# Patient Record
Sex: Male | Born: 1975 | Race: Black or African American | Hispanic: No | Smoking: Current every day smoker
Health system: Southern US, Community
[De-identification: ages and names within clinical notes are randomized; demographics above are authoritative.]

## PROBLEM LIST (undated history)

## (undated) DIAGNOSIS — K5792 Diverticulitis of intestine, part unspecified, without perforation or abscess without bleeding: Secondary | ICD-10-CM

## (undated) HISTORY — DX: Diverticulitis of intestine, part unspecified, without perforation or abscess without bleeding: K57.92

---

## 2011-04-26 ENCOUNTER — Inpatient Hospital Stay (INDEPENDENT_AMBULATORY_CARE_PROVIDER_SITE_OTHER)
Admission: RE | Admit: 2011-04-26 | Discharge: 2011-04-26 | Disposition: A | Discharge status: Home / Self Care | Payer: Self-pay | Source: Ambulatory Visit | Attending: Family Medicine | Admitting: Family Medicine

## 2011-04-26 DIAGNOSIS — A64 Unspecified sexually transmitted disease: Secondary | ICD-10-CM

## 2012-08-04 ENCOUNTER — Emergency Department (HOSPITAL_COMMUNITY): Payer: Self-pay

## 2012-08-04 ENCOUNTER — Emergency Department (HOSPITAL_COMMUNITY)
Admission: EM | Admit: 2012-08-04 | Discharge: 2012-08-04 | Disposition: A | Discharge status: Home / Self Care | Payer: Self-pay | Attending: Emergency Medicine | Admitting: Emergency Medicine

## 2012-08-04 ENCOUNTER — Encounter (HOSPITAL_COMMUNITY): Payer: Self-pay | Admitting: Emergency Medicine

## 2012-08-04 DIAGNOSIS — S9030XA Contusion of unspecified foot, initial encounter: Secondary | ICD-10-CM | POA: Insufficient documentation

## 2012-08-04 DIAGNOSIS — Y998 Other external cause status: Secondary | ICD-10-CM | POA: Insufficient documentation

## 2012-08-04 DIAGNOSIS — W208XXA Other cause of strike by thrown, projected or falling object, initial encounter: Secondary | ICD-10-CM | POA: Insufficient documentation

## 2012-08-04 DIAGNOSIS — F17200 Nicotine dependence, unspecified, uncomplicated: Secondary | ICD-10-CM | POA: Insufficient documentation

## 2012-08-04 DIAGNOSIS — Y9389 Activity, other specified: Secondary | ICD-10-CM | POA: Insufficient documentation

## 2012-08-04 DIAGNOSIS — S9031XA Contusion of right foot, initial encounter: Secondary | ICD-10-CM

## 2012-08-04 DIAGNOSIS — Y92009 Unspecified place in unspecified non-institutional (private) residence as the place of occurrence of the external cause: Secondary | ICD-10-CM | POA: Insufficient documentation

## 2012-08-04 MED ORDER — IBUPROFEN 800 MG PO TABS: 800.0000 mg | ORAL_TABLET | Freq: Three times a day (TID) | ORAL | Status: AC

## 2012-08-04 MED ORDER — HYDROCODONE-ACETAMINOPHEN 5-325 MG PO TABS
1.0000 | ORAL_TABLET | Freq: Once | ORAL | Status: AC
  Administered 2012-08-04: 1 via ORAL
  Filled 2012-08-04: qty 1

## 2012-08-04 NOTE — Progress Notes (Signed)
Orthopedic Tech Progress Note Patient Details:  Ruben Jacobs [birth year] [id number]  Ortho Devices Type of Ortho Device: Crutches Ortho Device/Splint Interventions: Adjustment   Cammer, Mickie Bail 08/04/2012, 11:22 AM
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
## 2012-08-04 NOTE — ED Notes (Signed)
Patient transported to X-ray 

## 2012-08-04 NOTE — ED Notes (Signed)
Pt. Is under Hospice Care , if we need any questions pleas call Claudette Laws  [phone]

## 2012-08-04 NOTE — ED Notes (Signed)
Pt. Stated, something fell on my foot, on the tot.  Rt. foot

## 2012-08-04 NOTE — ED Provider Notes (Signed)
Medical screening examination/treatment/procedure(s) were performed by non-physician practitioner and as supervising physician I was immediately available for consultation/collaboration.  Yandel Erhard Senske, MD 08/04/12 1556 

## 2012-08-27 ENCOUNTER — Emergency Department (HOSPITAL_COMMUNITY)
Admission: EM | Admit: 2012-08-27 | Discharge: 2012-08-27 | Disposition: A | Discharge status: Home / Self Care | Payer: Self-pay | Source: Home / Self Care | Attending: Emergency Medicine | Admitting: Emergency Medicine

## 2012-08-27 ENCOUNTER — Encounter (HOSPITAL_COMMUNITY): Payer: Self-pay

## 2012-08-27 DIAGNOSIS — R300 Dysuria: Secondary | ICD-10-CM

## 2012-08-27 LAB — POCT URINALYSIS DIP (DEVICE)
Bilirubin Urine: NEGATIVE
Glucose, UA: NEGATIVE mg/dL
Ketones, ur: NEGATIVE mg/dL
Leukocytes, UA: NEGATIVE
Nitrite: NEGATIVE
pH: 6 (ref 5.0–8.0)

## 2012-08-27 LAB — URINALYSIS, ROUTINE W REFLEX MICROSCOPIC
Glucose, UA: NEGATIVE mg/dL
Hgb urine dipstick: NEGATIVE
Ketones, ur: 15 mg/dL — AB
Protein, ur: NEGATIVE mg/dL
pH: 5.5 (ref 5.0–8.0)

## 2012-08-27 MED ORDER — CEFTRIAXONE SODIUM 250 MG IJ SOLR
INTRAMUSCULAR | Status: AC
  Filled 2012-08-27: qty 250

## 2012-08-27 MED ORDER — LIDOCAINE HCL (PF) 1 % IJ SOLN
INTRAMUSCULAR | Status: AC
  Filled 2012-08-27: qty 5

## 2012-08-27 MED ORDER — AZITHROMYCIN 250 MG PO TABS
1000.0000 mg | ORAL_TABLET | Freq: Once | ORAL | Status: AC
  Administered 2012-08-27: 1000 mg via ORAL

## 2012-08-27 MED ORDER — AZITHROMYCIN 250 MG PO TABS
ORAL_TABLET | ORAL | Status: AC
  Filled 2012-08-27: qty 4

## 2012-08-27 MED ORDER — CEFTRIAXONE SODIUM 250 MG IJ SOLR
250.0000 mg | Freq: Once | INTRAMUSCULAR | Status: AC
  Administered 2012-08-27: 250 mg via INTRAMUSCULAR

## 2012-08-27 NOTE — ED Provider Notes (Signed)
History     CSN: [id number]  Arrival date & time 08/27/12  4098   First MD Initiated Contact with Patient 08/27/12 1033      Chief Complaint  Patient presents with  . SEXUALLY TRANSMITTED DISEASE    (Consider location/radiation/quality/duration/timing/severity/associated sxs/prior treatment) HPI Comments: Patient reports intermittent dysuria for the past 2 days. Reports mild left upper abdominal pain prior to urinating, which resolved with urination. No nausea, vomiting. No change in bowel habits. No testicular pain, swelling. No genital rash. Sexually active with 2 male partners, her asymptomatic. One partner is new. He does not use condoms with either of them. No history of gonorrhea, Chlamydia, herpes, Trichomonas, HIV, syphilis, UTIs.  ROS as noted in HPI. All other ROS negative.   Patient is a 36 y.o. male presenting with dysuria. The history is provided by the patient. No language interpreter was used.  Dysuria  This is a new problem. The current episode started 2 days ago. The problem occurs intermittently. The problem has not changed since onset.The quality of the pain is described as burning. There has been no fever. He is sexually active. There is no history of pyelonephritis. Pertinent negatives include no chills, no sweats, no nausea, no vomiting, no discharge, no frequency, no hematuria, no hesitancy, no urgency and no flank pain. He has tried nothing for the symptoms.    History reviewed. No pertinent past medical history.  History reviewed. No pertinent past surgical history.  History reviewed. No pertinent family history.  History  Substance Use Topics  . Smoking status: Current Every Day Smoker    Types: Cigarettes  . Smokeless tobacco: Not on file  . Alcohol Use: Yes      Review of Systems  Constitutional: Negative for chills.  Gastrointestinal: Negative for nausea and vomiting.  Genitourinary: Positive for dysuria. Negative for hesitancy, urgency,  frequency, hematuria and flank pain.    Allergies  Review of patient's allergies indicates no known allergies.  Home Medications  No current outpatient prescriptions on file.  BP 144/85  Pulse 67  Temp 98.5 F (36.9 C) (Oral)  Resp 18  SpO2 98%  Physical Exam  Nursing note and vitals reviewed. Constitutional: He is oriented to person, place, and time. He appears well-developed and well-nourished.  HENT:  Head: Normocephalic and atraumatic.  Eyes: Conjunctivae normal and EOM are normal.  Neck: Normal range of motion.  Cardiovascular: Regular rhythm.   Pulmonary/Chest: Effort normal. No respiratory distress.  Abdominal: Normal appearance and bowel sounds are normal. He exhibits no distension. There is no tenderness. There is no CVA tenderness.  Genitourinary: Testes normal and penis normal. Circumcised. No penile erythema or penile tenderness. No discharge found.       no rash. Pt declined chaperone.   Musculoskeletal: Normal range of motion. He exhibits no edema and no tenderness.  Lymphadenopathy:       Right: No inguinal adenopathy present.       Left: No inguinal adenopathy present.  Neurological: He is alert and oriented to person, place, and time.  Skin: Skin is warm and dry. No rash noted.  Psychiatric: He has a normal mood and affect. His behavior is normal.    ED Course  Procedures (including critical care time)  Labs Reviewed  POCT URINALYSIS DIP (DEVICE) - Abnormal; Notable for the following:    Protein, ur 30 (*)     All other components within normal limits  URINALYSIS, ROUTINE W REFLEX MICROSCOPIC  GC/CHLAMYDIA PROBE AMP, GENITAL   No  results found.   1. Dysuria     Results for orders placed during the hospital encounter of 08/27/12  POCT URINALYSIS DIP (DEVICE)      Component Value Range   Glucose, UA NEGATIVE  NEGATIVE mg/dL   Bilirubin Urine NEGATIVE  NEGATIVE   Ketones, ur NEGATIVE  NEGATIVE mg/dL   Specific Gravity, Urine >=1.030  1.005 -  1.030   Hgb urine dipstick NEGATIVE  NEGATIVE   pH 6.0  5.0 - 8.0   Protein, ur 30 (*) NEGATIVE mg/dL   Urobilinogen, UA 0.2  0.0 - 1.0 mg/dL   Nitrite NEGATIVE  NEGATIVE   Leukocytes, UA NEGATIVE  NEGATIVE    MDM  H&P most c/w dysuria. Abdomen benign. Advised increased fluid intake. No signs of UTI. Sent off GC/chlamydia. Will  treat empirically now. Giving ceftriaxone 250 mg IM/azithro 1 gm po.  Advised pt to refrain from sexual contact until he knows lab results, symptoms resolve, and partner(s) are treated if necessary. Pt provided working phone number. Pt agrees.     Luiz Blare, MD 08/27/12 1300

## 2012-08-27 NOTE — ED Notes (Signed)
Concern for poss STD, UTI; c/o pain w urination, lower abdominal area pain: NAD

## 2012-08-28 LAB — GC/CHLAMYDIA PROBE AMP, GENITAL: GC Probe Amp, Genital: NEGATIVE

## 2013-05-13 ENCOUNTER — Encounter (HOSPITAL_COMMUNITY): Payer: Self-pay

## 2013-05-13 DIAGNOSIS — K089 Disorder of teeth and supporting structures, unspecified: Secondary | ICD-10-CM | POA: Insufficient documentation

## 2013-05-13 DIAGNOSIS — F17200 Nicotine dependence, unspecified, uncomplicated: Secondary | ICD-10-CM | POA: Insufficient documentation

## 2013-05-13 DIAGNOSIS — Z79899 Other long term (current) drug therapy: Secondary | ICD-10-CM | POA: Insufficient documentation

## 2013-05-13 NOTE — ED Notes (Signed)
Pt presents to ED c/o dental pain on the left lower side. Pt states that his tooth broke while eating yesterday afternoon and had had pain since then.

## 2013-05-14 ENCOUNTER — Emergency Department (HOSPITAL_COMMUNITY)
Admission: EM | Admit: 2013-05-14 | Discharge: 2013-05-14 | Disposition: A | Discharge status: Home / Self Care | Payer: Self-pay | Attending: Emergency Medicine | Admitting: Emergency Medicine

## 2013-05-14 DIAGNOSIS — K0889 Other specified disorders of teeth and supporting structures: Secondary | ICD-10-CM

## 2013-05-14 MED ORDER — HYDROCODONE-ACETAMINOPHEN 5-325 MG PO TABS: 1.0000 | ORAL_TABLET | Freq: Three times a day (TID) | ORAL | Status: DC | PRN

## 2013-05-14 MED ORDER — IBUPROFEN 400 MG PO TABS
800.0000 mg | ORAL_TABLET | Freq: Once | ORAL | Status: AC
  Administered 2013-05-14: 800 mg via ORAL
  Filled 2013-05-14: qty 2

## 2013-05-14 MED ORDER — PENICILLIN V POTASSIUM 500 MG PO TABS: 500.0000 mg | ORAL_TABLET | Freq: Four times a day (QID) | ORAL | Status: AC

## 2013-05-14 MED ORDER — HYDROCODONE-ACETAMINOPHEN 5-325 MG PO TABS: 2.0000 | ORAL_TABLET | Freq: Once | ORAL | Status: DC
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
## 2013-05-14 NOTE — ED Provider Notes (Signed)
Medical screening examination/treatment/procedure(s) were performed by non-physician practitioner and as supervising physician I was immediately available for consultation/collaboration. Devlyn Retter, MD, FACEP   Ieasha Boerema L Jenea Dake, MD 05/14/13 1240 

## 2013-08-17 ENCOUNTER — Emergency Department (HOSPITAL_COMMUNITY)
Admission: EM | Admit: 2013-08-17 | Discharge: 2013-08-17 | Disposition: A | Discharge status: Home / Self Care | Payer: Self-pay | Attending: Emergency Medicine | Admitting: Emergency Medicine

## 2013-08-17 ENCOUNTER — Encounter (HOSPITAL_COMMUNITY): Payer: Self-pay | Admitting: *Deleted

## 2013-08-17 DIAGNOSIS — F1021 Alcohol dependence, in remission: Secondary | ICD-10-CM | POA: Insufficient documentation

## 2013-08-17 DIAGNOSIS — R1110 Vomiting, unspecified: Secondary | ICD-10-CM | POA: Insufficient documentation

## 2013-08-17 DIAGNOSIS — R1032 Left lower quadrant pain: Secondary | ICD-10-CM | POA: Insufficient documentation

## 2013-08-17 DIAGNOSIS — F17200 Nicotine dependence, unspecified, uncomplicated: Secondary | ICD-10-CM | POA: Insufficient documentation

## 2013-08-17 DIAGNOSIS — R630 Anorexia: Secondary | ICD-10-CM | POA: Insufficient documentation

## 2013-08-17 DIAGNOSIS — R197 Diarrhea, unspecified: Secondary | ICD-10-CM | POA: Insufficient documentation

## 2013-08-17 DIAGNOSIS — R109 Unspecified abdominal pain: Secondary | ICD-10-CM

## 2013-08-17 LAB — CBC WITH DIFFERENTIAL/PLATELET
HCT: 42.5 % (ref 39.0–52.0)
Hemoglobin: 14.8 g/dL (ref 13.0–17.0)
Lymphocytes Relative: 28 % (ref 12–46)
Lymphs Abs: 2.1 10*3/uL (ref 0.7–4.0)
MCHC: 34.8 g/dL (ref 30.0–36.0)
Monocytes Absolute: 0.8 10*3/uL (ref 0.1–1.0)
Monocytes Relative: 11 % (ref 3–12)
Neutro Abs: 4.5 10*3/uL (ref 1.7–7.7)
Neutrophils Relative %: 59 % (ref 43–77)
RBC: 5.13 MIL/uL (ref 4.22–5.81)
WBC: 7.6 10*3/uL (ref 4.0–10.5)

## 2013-08-17 LAB — COMPREHENSIVE METABOLIC PANEL
Albumin: 3.9 g/dL (ref 3.5–5.2)
Alkaline Phosphatase: 67 U/L (ref 39–117)
BUN: 10 mg/dL (ref 6–23)
CO2: 22 mEq/L (ref 19–32)
Chloride: 101 mEq/L (ref 96–112)
Creatinine, Ser: 0.97 mg/dL (ref 0.50–1.35)
GFR calc non Af Amer: >90 mL/min (ref >90)
Glucose, Bld: 86 mg/dL (ref 70–99)
Potassium: 3.4 mEq/L — ABNORMAL LOW (ref 3.5–5.1)
Total Bilirubin: 0.4 mg/dL (ref 0.3–1.2)

## 2013-08-17 LAB — LIPASE, BLOOD: Lipase: 24 U/L (ref 11–59)

## 2013-08-17 LAB — URINALYSIS, ROUTINE W REFLEX MICROSCOPIC
Bilirubin Urine: NEGATIVE
Ketones, ur: 15 mg/dL — AB
Leukocytes, UA: NEGATIVE
Nitrite: NEGATIVE
Protein, ur: NEGATIVE mg/dL
pH: 6.5 (ref 5.0–8.0)

## 2013-08-17 MED ORDER — TRAMADOL HCL 50 MG PO TABS: 50.0000 mg | ORAL_TABLET | Freq: Four times a day (QID) | ORAL | Status: DC | PRN

## 2013-08-17 MED ORDER — ONDANSETRON 4 MG PO TBDP: 4.0000 mg | ORAL_TABLET | Freq: Three times a day (TID) | ORAL | Status: DC | PRN

## 2013-08-17 MED ORDER — MORPHINE SULFATE 4 MG/ML IJ SOLN
4.0000 mg | Freq: Once | INTRAMUSCULAR | Status: AC
  Administered 2013-08-17: 4 mg via INTRAVENOUS
  Filled 2013-08-17: qty 1

## 2013-08-17 MED ORDER — ONDANSETRON HCL 4 MG/2ML IJ SOLN
4.0000 mg | Freq: Once | INTRAMUSCULAR | Status: AC
  Administered 2013-08-17: 4 mg via INTRAVENOUS
  Filled 2013-08-17: qty 2

## 2013-08-17 MED ORDER — SODIUM CHLORIDE 0.9 % IV BOLUS (SEPSIS)
1000.0000 mL | Freq: Once | INTRAVENOUS | Status: AC
  Administered 2013-08-17: 1000 mL via INTRAVENOUS

## 2013-08-17 MED ORDER — POTASSIUM CHLORIDE CRYS ER 20 MEQ PO TBCR
40.0000 meq | EXTENDED_RELEASE_TABLET | Freq: Once | ORAL | Status: AC
  Administered 2013-08-17: 40 meq via ORAL
  Filled 2013-08-17: qty 2

## 2013-08-17 NOTE — Progress Notes (Signed)
Met patient at bedside.Role of case manager explained.Patient reports understanding. Patient requests information on Medication / PCP resources.Patient provided with the [facility] and the [facility] clinic/ urgent care resource sheet.Patient will follow up next week and make an appointment at the clinic. Patient provided with pricing option for Tramadol medications. Patient aware medication is not avaliable  via the [facility] program as it is a narcotic.Advised patient I would look at options for nausea medication and would return.Upon returning to the room at 15.20pm-  the patient had left the room.Waiting area checked.Patient was not in wait area.Patients RN Aware.

## 2013-08-17 NOTE — ED Notes (Signed)
Patient is resting comfortably. 

## 2013-08-17 NOTE — ED Notes (Addendum)
PT c/o lower abdominal pain since Thursday.  1 episode of emesis on Thurs, but since then only diarrhea.  Pt states every time he eats he has diarrhea.  Pt states he drinks 4 40 oz beers per day.

## 2013-08-17 NOTE — ED Notes (Signed)
Case manager speaking with pt. About RX refills.

## 2013-08-17 NOTE — ED Notes (Signed)
Pt walked to the bathroom with no difficulty

## 2013-08-17 NOTE — ED Provider Notes (Signed)
CSN: [id number]     Arrival date & time 08/17/13  1016 History   First MD Initiated Contact with Patient 08/17/13 1039     Chief Complaint  Patient presents with  . Abdominal Pain  . Diarrhea   (Consider location/radiation/quality/duration/timing/severity/associated sxs/prior Treatment) HPI Comments: Patient is a 37 yo M PMHx significant for ETOH dependence presenting tot he ED for three days of waxing and waning moderate to severe cramping non-radiating left lower abdominal pain w/o associated non-bloody diarrhea. Patient states he had one associated episode of non-bloody emesis as well as decreased PO intake, but denies fevers or chills. Patient states his pain and diarrhea are precipitated by eating and drinking. Patient denies any alleviating factors. No history of abdominal surgeries. No recent antibiotic use.    History reviewed. No pertinent past medical history. History reviewed. No pertinent past surgical history. No family history on file. History  Substance Use Topics  . Smoking status: Current Every Day Smoker -- 0.50 packs/day    Types: Cigarettes  . Smokeless tobacco: Not on file  . Alcohol Use: 16.8 oz/week    28 Cans of beer per week     Comment: 4 40 oz per day    Review of Systems  Constitutional: Negative for fever and chills.  Respiratory: Negative for shortness of breath.   Cardiovascular: Negative for chest pain.  Gastrointestinal: Positive for nausea, abdominal pain and diarrhea. Negative for blood in stool and anal bleeding.  Neurological: Negative for headaches.  All other systems reviewed and are negative.    Allergies  Review of patient's allergies indicates no known allergies.  Home Medications   Current Outpatient Rx  Name  Route  Sig  Dispense  Refill  . alum & mag hydroxide-simeth (MAALOX/MYLANTA) 200-200-20 MG/5ML suspension   Oral   Take 15 mLs by mouth every 6 (six) hours as needed for indigestion.         . ondansetron (ZOFRAN ODT) 4  MG disintegrating tablet   Oral   Take 1 tablet (4 mg total) by mouth every 8 (eight) hours as needed for nausea.   10 tablet   0   . traMADol (ULTRAM) 50 MG tablet   Oral   Take 1 tablet (50 mg total) by mouth every 6 (six) hours as needed for pain.   10 tablet   0    BP 110/74  Pulse 74  Temp(Src) 98.7 F (37.1 C) (Oral)  Resp 20  SpO2 98% Physical Exam  Constitutional: He is oriented to person, place, and time. He appears well-developed and well-nourished.  HENT:  Head: Normocephalic and atraumatic.  Right Ear: External ear normal.  Left Ear: External ear normal.  Nose: Nose normal.  Eyes: Conjunctivae are normal.  Neck: Neck supple.  Cardiovascular: Normal rate, regular rhythm and normal heart sounds.   Pulmonary/Chest: Effort normal and breath sounds normal.  Abdominal: Soft. Normal appearance and bowel sounds are normal. He exhibits no distension. There is tenderness in the left lower quadrant. There is no rigidity, no rebound and no guarding.  Musculoskeletal: Normal range of motion.  Neurological: He is alert and oriented to person, place, and time.  Skin: Skin is warm and dry. He is not diaphoretic.    ED Course  Procedures (including critical care time)  Medications  morphine 4 MG/ML injection 4 mg (4 mg Intravenous Given 08/17/13 1142)  ondansetron (ZOFRAN) injection 4 mg (4 mg Intravenous Given 08/17/13 1139)  sodium chloride 0.9 % bolus 1,000 mL (0 mLs  Intravenous Stopped 08/17/13 1409)  potassium chloride SA (K-DUR,KLOR-CON) CR tablet 40 mEq (40 mEq Oral Given 08/17/13 1415)    Labs Review Labs Reviewed  COMPREHENSIVE METABOLIC PANEL - Abnormal; Notable for the following:    Potassium 3.4 (*)    All other components within normal limits  URINALYSIS, ROUTINE W REFLEX MICROSCOPIC - Abnormal; Notable for the following:    Ketones, ur 15 (*)    All other components within normal limits  CBC WITH DIFFERENTIAL  LIPASE, BLOOD   Imaging Review No results  found.  MDM   1. Abdominal pain   2. Diarrhea     Afebrile, NAD, non-toxic appearing, AAOx4. Patient is nontoxic, nonseptic appearing, in no apparent distress.  Patient's pain and other symptoms adequately managed in emergency department. Fluids bolus given.  Labs and vitals reviewed.  Patient does not meet the SIRS or Sepsis criteria.  On repeat exam patient does not have a surgical abdomen and there are nor peritoneal signs.  No indication of appendicitis, bowel obstruction, bowel perforation, cholecystitis, diverticulitis.  Patient discharged home with symptomatic treatment and given strict instructions for follow-up with their primary care physician.  I have also discussed reasons to return immediately to the ER.  Patient expresses understanding and agrees with plan. Patient is stable at time of discharge. Patient d/w with Dr. Freida Busman, agrees with plan.          Jeannetta Ellis, PA-C 08/17/13 1706

## 2013-08-18 NOTE — ED Provider Notes (Signed)
Medical screening examination/treatment/procedure(s) were performed by non-physician practitioner and as supervising physician I was immediately available for consultation/collaboration.  Quaneisha Hanisch T Ellington Cornia, MD 08/18/13 2040 

## 2015-05-17 ENCOUNTER — Emergency Department (HOSPITAL_COMMUNITY)
Admission: EM | Admit: 2015-05-17 | Discharge: 2015-05-17 | Disposition: A | Discharge status: Home / Self Care | Payer: Self-pay | Attending: Emergency Medicine | Admitting: Emergency Medicine

## 2015-05-17 ENCOUNTER — Encounter (HOSPITAL_COMMUNITY): Payer: Self-pay | Admitting: *Deleted

## 2015-05-17 ENCOUNTER — Emergency Department (HOSPITAL_COMMUNITY): Payer: Self-pay

## 2015-05-17 DIAGNOSIS — Y92410 Unspecified street and highway as the place of occurrence of the external cause: Secondary | ICD-10-CM | POA: Insufficient documentation

## 2015-05-17 DIAGNOSIS — Y9389 Activity, other specified: Secondary | ICD-10-CM | POA: Insufficient documentation

## 2015-05-17 DIAGNOSIS — Y998 Other external cause status: Secondary | ICD-10-CM | POA: Insufficient documentation

## 2015-05-17 DIAGNOSIS — S8992XA Unspecified injury of left lower leg, initial encounter: Secondary | ICD-10-CM | POA: Insufficient documentation

## 2015-05-17 DIAGNOSIS — Z23 Encounter for immunization: Secondary | ICD-10-CM | POA: Insufficient documentation

## 2015-05-17 DIAGNOSIS — Z720 Tobacco use: Secondary | ICD-10-CM | POA: Insufficient documentation

## 2015-05-17 MED ORDER — TETANUS-DIPHTH-ACELL PERTUSSIS 5-2.5-18.5 LF-MCG/0.5 IM SUSP
0.5000 mL | Freq: Once | INTRAMUSCULAR | Status: AC
  Administered 2015-05-17: 0.5 mL via INTRAMUSCULAR
  Filled 2015-05-17: qty 0.5
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
## 2015-05-17 NOTE — ED Notes (Signed)
Declined W/C at D/C and was escorted to lobby by RN. 

## 2015-05-17 NOTE — Discharge Instructions (Signed)
Return to the emergency room with worsening of symptoms, new symptoms or with symptoms that are concerning, especially fevers, redness, swelling, numbness tingling, or weakness. RICE: Rest, Ice (three cycles of 20 mins on, off at least twice a day), compression/brace, elevation. Heating pad works well for back pain. Ibuprofen  (2 tablets ) every 5-6 hours for 3-5 days. Follow up with PCP/orthopedist if symptoms worsen or are persistent. Read below information and follow recommendations. Knee Pain The knee is the complex joint between your thigh and your lower leg. It is made up of bones, tendons, ligaments, and cartilage. The bones that make up the knee are:  The femur in the thigh.  The tibia and fibula in the lower leg.  The patella or kneecap riding in the groove on the lower femur. CAUSES  Knee pain is a common complaint with many causes. A few of these causes are:  Injury, such as:  A ruptured ligament or tendon injury.  Torn cartilage.  Medical conditions, such as:  Gout  Arthritis  Infections  Overuse, over training, or overdoing a physical activity. Knee pain can be minor or severe. Knee pain can accompany debilitating injury. Minor knee problems often respond well to self-care measures or get well on their own. More serious injuries may need medical intervention or even surgery. SYMPTOMS The knee is complex. Symptoms of knee problems can vary widely. Some of the problems are:  Pain with movement and weight bearing.  Swelling and tenderness.  Buckling of the knee.  Inability to straighten or extend your knee.  Your knee locks and you cannot straighten it.  Warmth and redness with pain and fever.  Deformity or dislocation of the kneecap. DIAGNOSIS  Determining what is wrong may be very straight forward such as when there is an injury. It can also be challenging because of the complexity of the knee. Tests to make a diagnosis may  include:  Your caregiver taking a history and doing a physical exam.  Routine X-rays can be used to rule out other problems. X-rays will not reveal a cartilage tear. Some injuries of the knee can be diagnosed by:  Arthroscopy a surgical technique by which a small video camera is inserted through tiny incisions on the sides of the knee. This procedure is used to examine and repair internal knee joint problems. Tiny instruments can be used during arthroscopy to repair the torn knee cartilage (meniscus).  Arthrography is a radiology technique. A contrast liquid is directly injected into the knee joint. Internal structures of the knee joint then become visible on X-ray film.  An MRI scan is a non X-ray radiology procedure in which magnetic fields and a computer produce two- or three-dimensional images of the inside of the knee. Cartilage tears are often visible using an MRI scanner. MRI scans have largely replaced arthrography in diagnosing cartilage tears of the knee.  Blood work.  Examination of the fluid that helps to lubricate the knee joint (synovial fluid). This is done by taking a sample out using a needle and a syringe. TREATMENT The treatment of knee problems depends on the cause. Some of these treatments are:  Depending on the injury, proper casting, splinting, surgery, or physical therapy care will be needed.  Give yourself adequate recovery time. Do not overuse your joints. If you begin to get sore during workout routines, back off. Slow down or do fewer repetitions.  For repetitive activities such as cycling or running, maintain your strength and nutrition.  Alternate  muscle groups. For example, if you are a weight lifter, work the upper body on one day and the lower body the next.  Either tight or weak muscles do not give the proper support for your knee. Tight or weak muscles do not absorb the stress placed on the knee joint. Keep the muscles surrounding the knee strong.  Take  care of mechanical problems.  If you have flat feet, orthotics or special shoes may help. See your caregiver if you need help.  Arch supports, sometimes with wedges on the inner or outer aspect of the heel, can help. These can shift pressure away from the side of the knee most bothered by osteoarthritis.  A brace called an "unloader" brace also may be used to help ease the pressure on the most arthritic side of the knee.  If your caregiver has prescribed crutches, braces, wraps or ice, use as directed. The acronym for this is PRICE. This means protection, rest, ice, compression, and elevation.  Nonsteroidal anti-inflammatory drugs (NSAIDs), can help relieve pain. But if taken immediately after an injury, they may actually increase swelling. Take NSAIDs with food in your stomach. Stop them if you develop stomach problems. Do not take these if you have a history of ulcers, stomach pain, or bleeding from the bowel. Do not take without your caregiver's approval if you have problems with fluid retention, heart failure, or kidney problems.  For ongoing knee problems, physical therapy may be helpful.  Glucosamine and chondroitin are over-the-counter dietary supplements. Both may help relieve the pain of osteoarthritis in the knee. These medicines are different from the usual anti-inflammatory drugs. Glucosamine may decrease the rate of cartilage destruction.  Injections of a corticosteroid drug into your knee joint may help reduce the symptoms of an arthritis flare-up. They may provide pain relief that lasts a few months. You may have to wait a few months between injections. The injections do have a small increased risk of infection, water retention, and elevated blood sugar levels.  Hyaluronic acid injected into damaged joints may ease pain and provide lubrication. These injections may work by reducing inflammation. A series of shots may give relief for as long as 6 months.  Topical painkillers.  Applying certain ointments to your skin may help relieve the pain and stiffness of osteoarthritis. Ask your pharmacist for suggestions. Many over the-counter products are approved for temporary relief of arthritis pain.  In some countries, doctors often prescribe topical NSAIDs for relief of chronic conditions such as arthritis and tendinitis. A review of treatment with NSAID creams found that they worked as well as oral medications but without the serious side effects. PREVENTION  Maintain a healthy weight. Extra pounds put more strain on your joints.  Get strong, stay limber. Weak muscles are a common cause of knee injuries. Stretching is important. Include flexibility exercises in your workouts.  Be smart about exercise. If you have osteoarthritis, chronic knee pain or recurring injuries, you may need to change the way you exercise. This does not mean you have to stop being active. If your knees ache after jogging or playing basketball, consider switching to swimming, water aerobics, or other low-impact activities, at least for a few days a week. Sometimes limiting high-impact activities will provide relief.  Make sure your shoes fit well. Choose footwear that is right for your sport.  Protect your knees. Use the proper gear for knee-sensitive activities. Use kneepads when playing volleyball or laying carpet. Buckle your seat belt every time you drive.  Most shattered kneecaps occur in car accidents.  Rest when you are tired. SEEK MEDICAL CARE IF:  You have knee pain that is continual and does not seem to be getting better.  SEEK IMMEDIATE MEDICAL CARE IF:  Your knee joint feels hot to the touch and you have a high fever. MAKE SURE YOU:   Understand these instructions.  Will watch your condition.  Will get help right away if you are not doing well or get worse. Document Released: 09/25/2007 Document Revised: 02/20/2012 Document Reviewed: 09/25/2007 [facility] Patient Information 2015  [place], [place]. This information is not intended to replace advice given to you by your health care provider. Make sure you discuss any questions you have with your health care provider.

## 2015-05-17 NOTE — ED Notes (Signed)
Patient returned from xray.

## 2015-06-30 ENCOUNTER — Encounter (HOSPITAL_COMMUNITY): Payer: Self-pay | Admitting: Emergency Medicine

## 2015-06-30 ENCOUNTER — Emergency Department (HOSPITAL_COMMUNITY)
Admission: EM | Admit: 2015-06-30 | Discharge: 2015-06-30 | Disposition: A | Discharge status: Home / Self Care | Payer: No Typology Code available for payment source | Attending: Physician Assistant | Admitting: Physician Assistant

## 2015-06-30 DIAGNOSIS — Y92410 Unspecified street and highway as the place of occurrence of the external cause: Secondary | ICD-10-CM | POA: Diagnosis not present

## 2015-06-30 DIAGNOSIS — Z720 Tobacco use: Secondary | ICD-10-CM | POA: Insufficient documentation

## 2015-06-30 DIAGNOSIS — Y999 Unspecified external cause status: Secondary | ICD-10-CM | POA: Insufficient documentation

## 2015-06-30 DIAGNOSIS — S39012A Strain of muscle, fascia and tendon of lower back, initial encounter: Secondary | ICD-10-CM | POA: Insufficient documentation

## 2015-06-30 DIAGNOSIS — Y9389 Activity, other specified: Secondary | ICD-10-CM | POA: Diagnosis not present

## 2015-06-30 DIAGNOSIS — T148XXA Other injury of unspecified body region, initial encounter: Secondary | ICD-10-CM

## 2015-06-30 DIAGNOSIS — S3992XA Unspecified injury of lower back, initial encounter: Secondary | ICD-10-CM | POA: Diagnosis present

## 2015-06-30 MED ORDER — IBUPROFEN 800 MG PO TABS
800.0000 mg | ORAL_TABLET | Freq: Once | ORAL | Status: AC
  Administered 2015-06-30: 800 mg via ORAL
  Filled 2015-06-30: qty 1

## 2015-06-30 MED ORDER — CYCLOBENZAPRINE HCL 10 MG PO TABS
10.0000 mg | ORAL_TABLET | Freq: Once | ORAL | Status: AC
  Administered 2015-06-30: 10 mg via ORAL
  Filled 2015-06-30: qty 1

## 2015-06-30 MED ORDER — CYCLOBENZAPRINE HCL 10 MG PO TABS: 10.0000 mg | ORAL_TABLET | Freq: Three times a day (TID) | ORAL | Status: DC | PRN

## 2015-06-30 MED ORDER — IBUPROFEN 800 MG PO TABS: 800.0000 mg | ORAL_TABLET | Freq: Three times a day (TID) | ORAL | Status: DC
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
## 2015-06-30 NOTE — Discharge Instructions (Signed)
Please return with any blood in your urine, nausea or vomiting, or fevers.   Muscle Strain A muscle strain is an injury that occurs when a muscle is stretched beyond its normal length. Usually a small number of muscle fibers are torn when this happens. Muscle strain is rated in degrees. First-degree strains have the least amount of muscle fiber tearing and pain. Second-degree and third-degree strains have increasingly more tearing and pain.  Usually, recovery from muscle strain takes 1-2 weeks. Complete healing takes 5-6 weeks.  CAUSES  Muscle strain happens when a sudden, violent force placed on a muscle stretches it too far. This may occur with lifting, sports, or a fall.  RISK FACTORS Muscle strain is especially common in athletes.  SIGNS AND SYMPTOMS At the site of the muscle strain, there may be:  Pain.  Bruising.  Swelling.  Difficulty using the muscle due to pain or lack of normal function. DIAGNOSIS  Your health care provider will perform a physical exam and ask about your medical history. TREATMENT  Often, the best treatment for a muscle strain is resting, icing, and applying cold compresses to the injured area.  HOME CARE INSTRUCTIONS   Use the PRICE method of treatment to promote muscle healing during the first 2-3 days after your injury. The PRICE method involves:  Protecting the muscle from being injured again.  Restricting your activity and resting the injured body part.  Icing your injury. To do this, put ice in a plastic bag. Place a towel between your skin and the bag. Then, apply the ice and leave it on from 15-20 minutes each hour. After the third day, switch to moist heat packs.  Apply compression to the injured area with a splint or elastic bandage. Be careful not to wrap it too tightly. This may interfere with blood circulation or increase swelling.  Elevate the injured body part above the level of your heart as often as you can.  Only take over-the-counter  or prescription medicines for pain, discomfort, or fever as directed by your health care provider.  Warming up prior to exercise helps to prevent future muscle strains. SEEK MEDICAL CARE IF:   You have increasing pain or swelling in the injured area.  You have numbness, tingling, or a significant loss of strength in the injured area. MAKE SURE YOU:   Understand these instructions.  Will watch your condition.  Will get help right away if you are not doing well or get worse. Document Released: 11/28/2005 Document Revised: 09/18/2013 Document Reviewed: 06/27/2013 [facility] Patient Information 2015 [place]. This information is not intended to replace advice given to you by your health care provider. Make sure you discuss any questions you have with your health care provider.
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
## 2016-08-18 ENCOUNTER — Encounter (HOSPITAL_COMMUNITY): Payer: Self-pay

## 2018-06-13 ENCOUNTER — Emergency Department (HOSPITAL_COMMUNITY): Payer: Self-pay

## 2018-06-13 ENCOUNTER — Other Ambulatory Visit: Payer: Self-pay

## 2018-06-13 ENCOUNTER — Emergency Department (HOSPITAL_COMMUNITY)
Admission: EM | Admit: 2018-06-13 | Discharge: 2018-06-13 | Disposition: A | Discharge status: Home / Self Care | Payer: Self-pay | Attending: Emergency Medicine | Admitting: Emergency Medicine

## 2018-06-13 ENCOUNTER — Encounter (HOSPITAL_COMMUNITY): Payer: Self-pay

## 2018-06-13 DIAGNOSIS — R1032 Left lower quadrant pain: Secondary | ICD-10-CM | POA: Insufficient documentation

## 2018-06-13 DIAGNOSIS — F17210 Nicotine dependence, cigarettes, uncomplicated: Secondary | ICD-10-CM | POA: Insufficient documentation

## 2018-06-13 DIAGNOSIS — K5732 Diverticulitis of large intestine without perforation or abscess without bleeding: Secondary | ICD-10-CM | POA: Insufficient documentation

## 2018-06-13 LAB — COMPREHENSIVE METABOLIC PANEL
ALBUMIN: 4.1 g/dL (ref 3.5–5.0)
ALT: 28 U/L (ref 0–44)
AST: 29 U/L (ref 15–41)
Alkaline Phosphatase: 74 U/L (ref 38–126)
Anion gap: 10 (ref 5–15)
BUN: 11 mg/dL (ref 6–20)
CHLORIDE: 106 mmol/L (ref 98–111)
CO2: 21 mmol/L — ABNORMAL LOW (ref 22–32)
Calcium: 9.3 mg/dL (ref 8.9–10.3)
Creatinine, Ser: 1.15 mg/dL (ref 0.61–1.24)
GFR calc Af Amer: >60 mL/min (ref >60)
GLUCOSE: 142 mg/dL — AB (ref 70–99)
Potassium: 4 mmol/L (ref 3.5–5.1)
Sodium: 137 mmol/L (ref 135–145)
Total Bilirubin: 1.2 mg/dL (ref 0.3–1.2)
Total Protein: 8 g/dL (ref 6.5–8.1)

## 2018-06-13 LAB — URINALYSIS, ROUTINE W REFLEX MICROSCOPIC
Bilirubin Urine: NEGATIVE
Glucose, UA: NEGATIVE mg/dL
Ketones, ur: NEGATIVE mg/dL
Leukocytes, UA: NEGATIVE
Nitrite: NEGATIVE
PH: 5 (ref 5.0–8.0)
Protein, ur: NEGATIVE mg/dL
SPECIFIC GRAVITY, URINE: 1.026 (ref 1.005–1.030)

## 2018-06-13 LAB — CBC
HEMATOCRIT: 45.1 % (ref 39.0–52.0)
Hemoglobin: 15.2 g/dL (ref 13.0–17.0)
MCH: 29.8 pg (ref 26.0–34.0)
MCHC: 33.7 g/dL (ref 30.0–36.0)
MCV: 88.4 fL (ref 78.0–100.0)
PLATELETS: 280 10*3/uL (ref 150–400)
RBC: 5.1 MIL/uL (ref 4.22–5.81)
RDW: 14.3 % (ref 11.5–15.5)
WBC: 12.2 10*3/uL — AB (ref 4.0–10.5)

## 2018-06-13 LAB — LIPASE, BLOOD: LIPASE: 29 U/L (ref 11–51)

## 2018-06-13 MED ORDER — METRONIDAZOLE 500 MG PO TABS: 500.0000 mg | ORAL_TABLET | Freq: Two times a day (BID) | ORAL | 0 refills | Status: DC

## 2018-06-13 MED ORDER — IOPAMIDOL (ISOVUE-300) INJECTION 61%
INTRAVENOUS | Status: AC
  Filled 2018-06-13: qty 100

## 2018-06-13 MED ORDER — MORPHINE SULFATE (PF) 4 MG/ML IV SOLN
4.0000 mg | Freq: Once | INTRAVENOUS | Status: AC
  Administered 2018-06-13: 4 mg via INTRAVENOUS
  Filled 2018-06-13: qty 1

## 2018-06-13 MED ORDER — SODIUM CHLORIDE 0.9 % IV BOLUS
1000.0000 mL | Freq: Once | INTRAVENOUS | Status: AC
  Administered 2018-06-13: 1000 mL via INTRAVENOUS

## 2018-06-13 MED ORDER — METRONIDAZOLE 500 MG PO TABS
500.0000 mg | ORAL_TABLET | Freq: Once | ORAL | Status: AC
  Administered 2018-06-13: 500 mg via ORAL
  Filled 2018-06-13: qty 1

## 2018-06-13 MED ORDER — IOPAMIDOL (ISOVUE-300) INJECTION 61%
100.0000 mL | Freq: Once | INTRAVENOUS | Status: AC | PRN
  Administered 2018-06-13: 100 mL via INTRAVENOUS

## 2018-06-13 MED ORDER — CIPROFLOXACIN HCL 500 MG PO TABS
500.0000 mg | ORAL_TABLET | Freq: Once | ORAL | Status: AC
  Administered 2018-06-13: 500 mg via ORAL
  Filled 2018-06-13: qty 1

## 2018-06-13 MED ORDER — CIPROFLOXACIN HCL 500 MG PO TABS: 500.0000 mg | ORAL_TABLET | Freq: Two times a day (BID) | ORAL | 0 refills | Status: DC

## 2018-06-13 NOTE — Discharge Instructions (Signed)
Your evaluated in the emergency department for left-sided low abdominal pain.  You had blood work and a CAT scan and this showed that you have acute diverticulitis.  This is usually treated by antibiotics and we are prescribing you 2 antibiotics to finish.  If you are running high fevers or worsening pain or other concerns please return to the emergency department.

## 2018-06-13 NOTE — ED Notes (Signed)
Pt made aware urine needed. Pt provided with a urinal

## 2018-06-13 NOTE — ED Notes (Signed)
Patient transported to CT 

## 2018-06-13 NOTE — ED Notes (Signed)
MD at bedside. 

## 2018-06-13 NOTE — ED Provider Notes (Signed)
[facility]-EMERGENCY DEPT Provider Note   CSN: [id number] Arrival date & time: 06/13/18  0906     History   Chief Complaint Chief Complaint  Patient presents with  . Abdominal Pain    HPI Ruben Jacobs is a 42 y.o. male.  He has no significant past medical history.  He is complaining of lower abdominal pain that started on the generalized lower abdomen but has migrated to be over into the left lower quadrant since yesterday.  It is not associated with any symptoms including no fevers no chills no nausea no vomiting no diarrhea no constipation no urinary symptoms.  It does not radiate anywhere.  He states it is worse with moving and improves with being [place].  He has tried nothing for it.  He thought the pain was gas in his facets sharp and stabbing and moderate in nature.  The history is provided by the patient.  Abdominal Pain   This is a new problem. The current episode started yesterday. The problem occurs constantly. The problem has been gradually worsening. The pain is associated with an unknown factor. The pain is located in the LLQ. The quality of the pain is sharp. The pain is moderate. Pertinent negatives include anorexia, fever, diarrhea, hematochezia, melena, nausea, vomiting, constipation, dysuria, frequency, hematuria and arthralgias. The symptoms are aggravated by activity and certain positions. The symptoms are relieved by being [place].    History reviewed. No pertinent past medical history.  There are no active problems to display for this patient.   History reviewed. No pertinent surgical history.      Home Medications    Prior to Admission medications   Medication Sig Start Date End Date Taking? Authorizing Provider  cyclobenzaprine (FLEXERIL) 10 MG tablet Take 1 tablet (10 mg total) by mouth 3 (three) times daily as needed for muscle spasms. 06/30/15   Mackuen, Courteney Lyn, MD  ibuprofen (ADVIL,MOTRIN) 800 MG tablet Take 1 tablet (800 mg  total) by mouth 3 (three) times daily. 06/30/15   Mackuen, Courteney Lyn, MD  Menthol, Topical Analgesic, (ICY HOT BACK EX) Apply 1 Package topically once.    [provider]  ondansetron (ZOFRAN ODT) 4 MG disintegrating tablet Take 1 tablet (4 mg total) by mouth every 8 (eight) hours as needed for nausea. Patient not taking: Reported on 06/30/2015 08/17/13   Francee Piccolo, PA-C  PRESCRIPTION MEDICATION Apply 1 patch topically once. PAIN PATCH    [provider]  traMADol (ULTRAM) 50 MG tablet Take 1 tablet (50 mg total) by mouth every 6 (six) hours as needed for pain. Patient not taking: Reported on 06/30/2015 08/17/13   Francee Piccolo, PA-C    Family History History reviewed. No pertinent family history.  Social History Social History   Tobacco Use  . Smoking status: Current Every Day Smoker    Packs/day: 0.15    Types: Cigarettes  . Smokeless tobacco: Never Used  Substance Use Topics  . Alcohol use: Yes    Alcohol/week: 16.8 oz    Types: 28 Cans of beer per week    Comment: 4 40 oz per day  . Drug use: Yes    Types: Marijuana     Allergies   Patient has no known allergies.   Review of Systems Review of Systems  Constitutional: Negative for chills and fever.  HENT: Negative for ear pain and sore throat.   Eyes: Negative for pain and visual disturbance.  Respiratory: Negative for cough and shortness of breath.  Cardiovascular: Negative for chest pain and palpitations.  Gastrointestinal: Positive for abdominal pain. Negative for anorexia, constipation, diarrhea, hematochezia, melena, nausea and vomiting.  Genitourinary: Negative for dysuria, frequency and hematuria.  Musculoskeletal: Negative for arthralgias and back pain.  Skin: Negative for color change and rash.  Neurological: Negative for seizures and syncope.  All other systems reviewed and are negative.    Physical Exam Updated Vital Signs BP 138/84 (BP Location: Right Arm)   Pulse  60   Temp 97.7 F (36.5 C) (Oral)   Resp 18   Ht 5\' 7"  (1.702 m)   Wt 97.5 kg (215 lb)   SpO2 100%   BMI 33.67 kg/m   Physical Exam  Constitutional: He appears well-developed and well-nourished.  Non-toxic appearance.  HENT:  Head: Normocephalic and atraumatic.  Mouth/Throat: Oropharynx is clear and moist.  Eyes: Pupils are equal, round, and reactive to light. Conjunctivae and EOM are normal.  Neck: Neck supple.  Cardiovascular: Normal rate and regular rhythm.  No murmur heard. Pulmonary/Chest: Effort normal and breath sounds normal. No respiratory distress.  Abdominal: Soft. There is tenderness in the left lower quadrant. There is no rigidity, no rebound and no guarding. No hernia.  Musculoskeletal: He exhibits no edema or tenderness.  Neurological: He is alert.  Skin: Skin is warm and dry. Capillary refill takes less than 2 seconds.  Psychiatric: He has a normal mood and affect.  Nursing note and vitals reviewed.    ED Treatments / Results  Labs (all labs ordered are listed, but only abnormal results are displayed) Labs Reviewed  COMPREHENSIVE METABOLIC PANEL - Abnormal; Notable for the following components:      Result Value   CO2 21 (*)    Glucose, Bld 142 (*)    All other components within normal limits  CBC - Abnormal; Notable for the following components:   WBC 12.2 (*)    All other components within normal limits  URINALYSIS, ROUTINE W REFLEX MICROSCOPIC - Abnormal; Notable for the following components:   Hgb urine dipstick SMALL (*)    Bacteria, UA RARE (*)    All other components within normal limits  LIPASE, BLOOD    EKG None  Radiology Ct Abdomen Pelvis W Contrast  Result Date: 06/13/2018 CLINICAL DATA:  Abdominal pain with diverticulitis suspected EXAM: CT ABDOMEN AND PELVIS WITH CONTRAST TECHNIQUE: Multidetector CT imaging of the abdomen and pelvis was performed using the standard protocol following bolus administration of intravenous contrast.  CONTRAST:  100mL ISOVUE-300 IOPAMIDOL (ISOVUE-300) INJECTION 61% COMPARISON:  None. FINDINGS: Lower chest:  No contributory findings. Hepatobiliary: No focal liver abnormality.No evidence of biliary obstruction or stone. Pancreas: Unremarkable. Spleen: Unremarkable. Adrenals/Urinary Tract: Negative adrenals. No hydronephrosis or stone. Small right renal cystic intensity unremarkable bladder. Stomach/Bowel: Focal segment of colonic wall thickening from submucosal low-density at the descending sigmoid junction, centered around a thickened diverticulum. Distal colonic diverticulosis that is overall mild in extent. No obstruction. No appendicitis. Vascular/Lymphatic: No acute vascular abnormality. No mass or adenopathy. Reproductive:No pathologic findings. Other: No ascites or pneumoperitoneum. Musculoskeletal: No acute abnormalities. IMPRESSION: 1. Diverticulitis at the descending sigmoid junction. No complicating abscess or pneumoperitoneum. 2. Descending and sigmoid colonic diverticulosis. Electronically Signed   By: Marnee SpringJonathon  Watts M.D.   On: 06/13/2018 11:25    Procedures Procedures (including critical care time)  Medications Ordered in ED Medications  sodium chloride 0.9 % bolus 1,000 mL (has no administration in time range)  morphine 4 MG/ML injection 4 mg (has no administration in time  range)     Initial Impression / Assessment and Plan / ED Course  I have reviewed the triage vital signs and the nursing notes.  Pertinent labs & imaging results that were available during my care of the patient were reviewed by me and considered in my medical decision making (see chart for details).  Clinical Course as of Jun 15 847  Wed Jun 13, 2018  8646 42 year old male with no significant past medical history now here with left lower quadrant pain since yesterday.  He does not appear systemically ill and endorses no associated symptoms.  He is focally tender in his left lower quadrant although there is no  guarding or rebound.  He is getting some screening labs and IV fluids pain medicine and a CT.  Differential includes diverticulitis, abscess, ureterolithiasis   [MB]  1020 Patient's initial work-up is been unremarkable other than a white count of 12.2 which is slightly elevated.  He is pending a CAT scan.   [MB]  1145 Updated patient on the results of his tests and I feel that he could go home on oral antibiotics.  He is comfortable with the plan and will return if any worsening symptoms.   [MB]    Clinical Course User Index [MB] Terrilee Files, MD    Final Clinical Impressions(s) / ED Diagnoses   Final diagnoses:  Diverticulitis large intestine w/o perforation or abscess w/o bleeding    ED Discharge Orders        Ordered    ciprofloxacin (CIPRO) 500 MG tablet  2 times daily     06/13/18 1151    metroNIDAZOLE (FLAGYL) 500 MG tablet  2 times daily     06/13/18 1151       Terrilee Files, MD 06/14/18 [phone]

## 2018-06-13 NOTE — ED Triage Notes (Signed)
Patient c/o constant LLQ pain since yesterday. Patient denies any N/v/D

## 2018-07-05 ENCOUNTER — Emergency Department (HOSPITAL_COMMUNITY): Payer: Self-pay

## 2018-07-05 ENCOUNTER — Encounter (HOSPITAL_COMMUNITY): Payer: Self-pay

## 2018-07-05 ENCOUNTER — Emergency Department (HOSPITAL_COMMUNITY)
Admission: EM | Admit: 2018-07-05 | Discharge: 2018-07-05 | Disposition: A | Discharge status: Home / Self Care | Payer: Self-pay | Attending: Emergency Medicine | Admitting: Emergency Medicine

## 2018-07-05 DIAGNOSIS — Z79899 Other long term (current) drug therapy: Secondary | ICD-10-CM | POA: Insufficient documentation

## 2018-07-05 DIAGNOSIS — W228XXA Striking against or struck by other objects, initial encounter: Secondary | ICD-10-CM | POA: Insufficient documentation

## 2018-07-05 DIAGNOSIS — F17210 Nicotine dependence, cigarettes, uncomplicated: Secondary | ICD-10-CM | POA: Insufficient documentation

## 2018-07-05 DIAGNOSIS — Y939 Activity, unspecified: Secondary | ICD-10-CM | POA: Insufficient documentation

## 2018-07-05 DIAGNOSIS — Y990 Civilian activity done for income or pay: Secondary | ICD-10-CM | POA: Insufficient documentation

## 2018-07-05 DIAGNOSIS — S93509A Unspecified sprain of unspecified toe(s), initial encounter: Secondary | ICD-10-CM

## 2018-07-05 DIAGNOSIS — S93502A Unspecified sprain of left great toe, initial encounter: Secondary | ICD-10-CM | POA: Insufficient documentation

## 2018-07-05 DIAGNOSIS — Y929 Unspecified place or not applicable: Secondary | ICD-10-CM | POA: Insufficient documentation

## 2018-07-05 NOTE — ED Triage Notes (Signed)
Pt reports that he kicked something and now his L toe is swollen and hurting

## 2018-07-05 NOTE — ED Provider Notes (Signed)
[facility] EMERGENCY DEPARTMENT Provider Note   CSN: [id number] Arrival date & time: 07/05/18  0047     History   Chief Complaint Chief Complaint  Patient presents with  . Toe Injury    HPI Ruben Jacobs is a 42 y.o. male.  Patient presents to the emergency department with a chief complaint of left toe pain.  He states that he kicked something at work and hurt his left toe.  He states that he felt a pop.  This happened yesterday afternoon.  He has not taken anything for symptoms.  He complains of pain with palpation and movement.  The history is provided by the patient. No language interpreter was used.    History reviewed. No pertinent past medical history.  There are no active problems to display for this patient.   History reviewed. No pertinent surgical history.      Home Medications    Prior to Admission medications   Medication Sig Start Date End Date Taking? Authorizing Provider  ciprofloxacin (CIPRO) 500 MG tablet Take 1 tablet (500 mg total) by mouth 2 (two) times daily. 06/13/18   Terrilee Files, MD  cyclobenzaprine (FLEXERIL) 10 MG tablet Take 1 tablet (10 mg total) by mouth 3 (three) times daily as needed for muscle spasms. Patient not taking: Reported on 06/13/2018 06/30/15   Mackuen, Courteney Lyn, MD  ibuprofen (ADVIL,MOTRIN) 800 MG tablet Take 1 tablet (800 mg total) by mouth 3 (three) times daily. Patient not taking: Reported on 06/13/2018 06/30/15   Mackuen, Courteney Lyn, MD  metroNIDAZOLE (FLAGYL) 500 MG tablet Take 1 tablet (500 mg total) by mouth 2 (two) times daily. 06/13/18   Terrilee Files, MD  ondansetron (ZOFRAN ODT) 4 MG disintegrating tablet Take 1 tablet (4 mg total) by mouth every 8 (eight) hours as needed for nausea. Patient not taking: Reported on 06/30/2015 08/17/13   Francee Piccolo, PA-C  PRESCRIPTION MEDICATION Apply 1 patch topically once. PAIN PATCH    [provider]  traMADol (ULTRAM) 50 MG tablet Take  1 tablet (50 mg total) by mouth every 6 (six) hours as needed for pain. Patient not taking: Reported on 06/30/2015 08/17/13   Francee Piccolo, PA-C    Family History No family history on file.  Social History Social History   Tobacco Use  . Smoking status: Current Every Day Smoker    Packs/day: 0.15    Types: Cigarettes  . Smokeless tobacco: Never Used  Substance Use Topics  . Alcohol use: Yes    Alcohol/week: 16.8 oz    Types: 28 Cans of beer per week    Comment: 4 40 oz per day  . Drug use: Yes    Types: Marijuana     Allergies   Patient has no known allergies.   Review of Systems Review of Systems  All other systems reviewed and are negative.    Physical Exam Updated Vital Signs BP 140/89 (BP Location: Right Arm)   Pulse (!) 102   Temp 98.8 F (37.1 C) (Oral)   Resp 18   SpO2 96%   Physical Exam  Constitutional: He is oriented to person, place, and time. He appears well-developed and well-nourished.  HENT:  Head: Normocephalic and atraumatic.  Eyes: Conjunctivae and EOM are normal.  Neck: Normal range of motion.  Cardiovascular: Normal rate.  Pulmonary/Chest: Effort normal.  Abdominal: He exhibits no distension.  Musculoskeletal: Normal range of motion.  No abnormality or deformity about the left toe, there is some  mild tenderness about the left MTP with minimal swelling  Neurological: He is alert and oriented to person, place, and time.  Skin: Skin is dry.  Psychiatric: He has a normal mood and affect. His behavior is normal. Judgment and thought content normal.  Nursing note and vitals reviewed.    ED Treatments / Results  Labs (all labs ordered are listed, but only abnormal results are displayed) Labs Reviewed - No data to display  EKG None  Radiology Dg Toe Great Left  Result Date: 07/05/2018 CLINICAL DATA:  Pain and swelling of the left first toe after injury tonight. Stump to the toe after hitting something. EXAM: LEFT GREAT TOE  COMPARISON:  None. FINDINGS: There is no evidence of fracture or dislocation. There is no evidence of arthropathy or other focal bone abnormality. Soft tissues are unremarkable. IMPRESSION: Negative. Electronically Signed   By: Burman NievesWilliam  Stevens M.D.   On: 07/05/2018 01:16    Procedures Procedures (including critical care time)  Medications Ordered in ED Medications - No data to display   Initial Impression / Assessment and Plan / ED Course  I have reviewed the triage vital signs and the nursing notes.  Pertinent labs & imaging results that were available during my care of the patient were reviewed by me and considered in my medical decision making (see chart for details).     Patient with left toe injury.  He kicked something at work.  Felt the pop.  He does have some pain with toe movement.  There is no abnormality or deformity on exam.  Plain films are negative.  Recommend Ortho follow-up.  Will give postop shoe.  Final Clinical Impressions(s) / ED Diagnoses   Final diagnoses:  Sprain of toe, initial encounter    ED Discharge Orders    None       Roxy HorsemanBrowning, Agron Swiney, PA-C 07/05/18 0340    Dione BoozeGlick, David, MD 07/05/18 [phone]

## 2018-07-05 NOTE — Progress Notes (Signed)
Orthopedic Tech Progress Note Patient Details:  Ruben Jacobs 1976/04/02 [id number]  Ortho Devices Type of Ortho Device: Postop shoe/boot Ortho Device/Splint Location: lle Ortho Device/Splint Interventions: Ordered, Application, Adjustment   Post Interventions Patient Tolerated: Well Instructions Provided: Care of device, Adjustment of device   Trinna PostMartinez, Ruben Jacobs 07/05/2018, 3:32 AM

## 2018-07-13 ENCOUNTER — Other Ambulatory Visit: Payer: Self-pay

## 2018-07-13 ENCOUNTER — Emergency Department (HOSPITAL_COMMUNITY)
Admission: EM | Admit: 2018-07-13 | Discharge: 2018-07-13 | Disposition: A | Discharge status: Home / Self Care | Payer: Self-pay | Attending: Emergency Medicine | Admitting: Emergency Medicine

## 2018-07-13 DIAGNOSIS — K5792 Diverticulitis of intestine, part unspecified, without perforation or abscess without bleeding: Secondary | ICD-10-CM | POA: Insufficient documentation

## 2018-07-13 DIAGNOSIS — F17210 Nicotine dependence, cigarettes, uncomplicated: Secondary | ICD-10-CM | POA: Insufficient documentation

## 2018-07-13 LAB — COMPREHENSIVE METABOLIC PANEL
ALT: 26 U/L (ref 0–44)
ANION GAP: 11 (ref 5–15)
AST: 24 U/L (ref 15–41)
Albumin: 3.7 g/dL (ref 3.5–5.0)
Alkaline Phosphatase: 60 U/L (ref 38–126)
BUN: 6 mg/dL (ref 6–20)
CHLORIDE: 107 mmol/L (ref 98–111)
CO2: 22 mmol/L (ref 22–32)
CREATININE: 0.95 mg/dL (ref 0.61–1.24)
Calcium: 8.8 mg/dL — ABNORMAL LOW (ref 8.9–10.3)
GFR calc non Af Amer: >60 mL/min (ref >60)
Glucose, Bld: 107 mg/dL — ABNORMAL HIGH (ref 70–99)
Potassium: 3.9 mmol/L (ref 3.5–5.1)
SODIUM: 140 mmol/L (ref 135–145)
Total Bilirubin: 0.5 mg/dL (ref 0.3–1.2)
Total Protein: 6.8 g/dL (ref 6.5–8.1)

## 2018-07-13 LAB — URINALYSIS, ROUTINE W REFLEX MICROSCOPIC
Bilirubin Urine: NEGATIVE
GLUCOSE, UA: NEGATIVE mg/dL
HGB URINE DIPSTICK: NEGATIVE
Ketones, ur: NEGATIVE mg/dL
Leukocytes, UA: NEGATIVE
Nitrite: NEGATIVE
PH: 5.5 (ref 5.0–8.0)
Protein, ur: NEGATIVE mg/dL
SPECIFIC GRAVITY, URINE: 1.025 (ref 1.005–1.030)

## 2018-07-13 LAB — CBC
HCT: 43.7 % (ref 39.0–52.0)
HEMOGLOBIN: 14.1 g/dL (ref 13.0–17.0)
MCH: 28.9 pg (ref 26.0–34.0)
MCHC: 32.3 g/dL (ref 30.0–36.0)
MCV: 89.5 fL (ref 78.0–100.0)
Platelets: 250 10*3/uL (ref 150–400)
RBC: 4.88 MIL/uL (ref 4.22–5.81)
RDW: 13.7 % (ref 11.5–15.5)
WBC: 6.3 10*3/uL (ref 4.0–10.5)

## 2018-07-13 LAB — LIPASE, BLOOD: LIPASE: 39 U/L (ref 11–51)

## 2018-07-13 MED ORDER — AMOXICILLIN-POT CLAVULANATE 875-125 MG PO TABS: 1.0000 | ORAL_TABLET | Freq: Two times a day (BID) | ORAL | 0 refills | Status: DC

## 2018-07-13 MED ORDER — SODIUM CHLORIDE 0.9 % IV BOLUS
1000.0000 mL | Freq: Once | INTRAVENOUS | Status: AC
  Administered 2018-07-13: 1000 mL via INTRAVENOUS

## 2018-07-13 MED ORDER — KETOROLAC TROMETHAMINE 30 MG/ML IJ SOLN
30.0000 mg | Freq: Once | INTRAMUSCULAR | Status: AC
  Administered 2018-07-13: 30 mg via INTRAVENOUS
  Filled 2018-07-13: qty 1

## 2018-07-13 NOTE — ED Triage Notes (Signed)
Patient seen here on 07/07 for abd pain and dx with diverticulitis; completed round of abx/tx. States that he believes that it hs "flare-up" again.

## 2018-07-13 NOTE — ED Notes (Signed)
Pt aware that a urine sample is needed; urinal at bedside 

## 2018-07-13 NOTE — ED Notes (Signed)
ED Provider at bedside. 

## 2018-07-13 NOTE — Discharge Instructions (Signed)
Return to ED for worsening symptoms, blood in your stool, chest pain, shortness of breath, lightheadedness or loss of consciousness.

## 2018-07-13 NOTE — ED Provider Notes (Signed)
[facility] EMERGENCY DEPARTMENT Provider Note   CSN: [id number] Arrival date & time: 07/13/18  4098     History   Chief Complaint Chief Complaint  Patient presents with  . Abdominal Pain    HPI Ruben Jacobs is a 42 y.o. male with a past medical history of diverticulitis, who presents to ED for evaluation of 2-day history of left lower quadrant abdominal pain, several episodes of diarrhea.  He is unsure if this is related to all the fast food that he has been eating and the alcohol that he has been drinking daily for the past week.  States that it feels similar to the diverticulitis flareup that he had on 7/7.  States that the flareup was improved with p.o. antibiotics given to him.  Denies any nausea, vomiting, urinary symptoms, chest pain, shortness of breath, hematochezia, melena, fever.  He denies any prior abdominal surgeries.  He reports occasional alcohol use, daily marijuana use.  Denies any tobacco use.  HPI  No past medical history on file.  There are no active problems to display for this patient.   No past surgical history on file.      Home Medications    Prior to Admission medications   Medication Sig Start Date End Date Taking? Authorizing Provider  amoxicillin-clavulanate (AUGMENTIN) 875-125 MG tablet Take 1 tablet by mouth every 12 (twelve) hours. 07/13/18   Maeby Vankleeck, PA-C  ciprofloxacin (CIPRO) 500 MG tablet Take 1 tablet (500 mg total) by mouth 2 (two) times daily. Patient not taking: Reported on 07/13/2018 06/13/18   Terrilee Files, MD  cyclobenzaprine (FLEXERIL) 10 MG tablet Take 1 tablet (10 mg total) by mouth 3 (three) times daily as needed for muscle spasms. Patient not taking: Reported on 06/13/2018 06/30/15   Mackuen, Courteney Lyn, MD  ibuprofen (ADVIL,MOTRIN) 800 MG tablet Take 1 tablet (800 mg total) by mouth 3 (three) times daily. Patient not taking: Reported on 06/13/2018 06/30/15   Mackuen, Courteney Lyn, MD  metroNIDAZOLE  (FLAGYL) 500 MG tablet Take 1 tablet (500 mg total) by mouth 2 (two) times daily. Patient not taking: Reported on 07/13/2018 06/13/18   Terrilee Files, MD  ondansetron (ZOFRAN ODT) 4 MG disintegrating tablet Take 1 tablet (4 mg total) by mouth every 8 (eight) hours as needed for nausea. Patient not taking: Reported on 06/30/2015 08/17/13   Piepenbrink, Victorino Dike, PA-C  traMADol (ULTRAM) 50 MG tablet Take 1 tablet (50 mg total) by mouth every 6 (six) hours as needed for pain. Patient not taking: Reported on 06/30/2015 08/17/13   Francee Piccolo, PA-C    Family History No family history on file.  Social History Social History   Tobacco Use  . Smoking status: Current Every Day Smoker    Packs/day: 0.15    Types: Cigarettes  . Smokeless tobacco: Never Used  Substance Use Topics  . Alcohol use: Yes    Alcohol/week: 16.8 oz    Types: 28 Cans of beer per week    Comment: 4 40 oz per day  . Drug use: Yes    Types: Marijuana     Allergies   Patient has no known allergies.   Review of Systems Review of Systems  Constitutional: Negative for appetite change, chills and fever.  HENT: Negative for ear pain, rhinorrhea, sneezing and sore throat.   Eyes: Negative for photophobia and visual disturbance.  Respiratory: Negative for cough, chest tightness, shortness of breath and wheezing.   Cardiovascular: Negative for chest pain and  palpitations.  Gastrointestinal: Positive for abdominal pain and diarrhea. Negative for blood in stool, constipation, nausea and vomiting.  Genitourinary: Negative for dysuria, hematuria and urgency.  Musculoskeletal: Negative for myalgias.  Skin: Negative for rash.  Neurological: Negative for dizziness, weakness and light-headedness.     Physical Exam Updated Vital Signs BP 126/85   Pulse 61   Temp 97.8 F (36.6 C) (Oral)   Resp 16   Ht 5\' 7"  (1.702 m)   Wt 95.3 kg (210 lb)   SpO2 100%   BMI 32.89 kg/m   Physical Exam  Constitutional: He appears  well-developed and well-nourished. No distress.  HENT:  Head: Normocephalic and atraumatic.  Nose: Nose normal.  Eyes: Conjunctivae and EOM are normal. Left eye exhibits no discharge. No scleral icterus.  Neck: Normal range of motion. Neck supple.  Cardiovascular: Normal rate, regular rhythm, normal heart sounds and intact distal pulses. Exam reveals no gallop and no friction rub.  No murmur heard. Pulmonary/Chest: Effort normal and breath sounds normal. No respiratory distress.  Abdominal: Soft. Bowel sounds are normal. He exhibits no distension. There is tenderness in the left lower quadrant. There is no rebound and no guarding.  Musculoskeletal: Normal range of motion. He exhibits no edema.  Neurological: He is alert. He exhibits normal muscle tone. Coordination normal.  Skin: Skin is warm and dry. No rash noted.  Psychiatric: He has a normal mood and affect.  Nursing note and vitals reviewed.    ED Treatments / Results  Labs (all labs ordered are listed, but only abnormal results are displayed) Labs Reviewed  COMPREHENSIVE METABOLIC PANEL - Abnormal; Notable for the following components:      Result Value   Glucose, Bld 107 (*)    Calcium 8.8 (*)    All other components within normal limits  URINALYSIS, ROUTINE W REFLEX MICROSCOPIC - Abnormal; Notable for the following components:   Color, Urine YELLOW (*)    APPearance CLEAR (*)    All other components within normal limits  LIPASE, BLOOD  CBC    EKG None  Radiology No results found.  Procedures Procedures (including critical care time)  Medications Ordered in ED Medications  sodium chloride 0.9 % bolus 1,000 mL (0 mLs Intravenous Stopped 07/13/18 0749)  ketorolac (TORADOL) 30 MG/ML injection 30 mg (30 mg Intravenous Given 07/13/18 0748)     Initial Impression / Assessment and Plan / ED Course  I have reviewed the triage vital signs and the nursing notes.  Pertinent labs & imaging results that were available  during my care of the patient were reviewed by me and considered in my medical decision making (see chart for details).  Clinical Course as of Jul 13 830  Fri Jul 13, 2018  0815 On repeat exam after fluids and anti-inflammatories, abdomen is now nontender. PO challenge successful.   [HK]    Clinical Course User Index [HK] Dietrich Pates, PA-C    42 year old male with past medical history of diverticulosis presents to ED for evaluation of 2-day history of left lower quadrant abdominal pain, several episodes of nonbloody diarrhea.  Denies any fever, vomiting, urinary symptoms, chest pain.  States that it feels similar to a diverticulitis flareup.  On physical exam he is overall well-appearing.  Tenderness to palpation of the left lower quadrant with no rebound or guarding noted.  He is afebrile.  Vital signs are within normal limits.  Lab work including CBC, CMP, lipase, urinalysis all unremarkable.  Patient states that this may be  due to something that he ate or the excessive amount of alcohol that he is drank for the past week, but states that it feels like diverticulitis flare up.  Symptoms could be viral but due to concern for diverticulitis, will treat with Augmentin.  Will advise him to slowly advance diet as tolerated.  I doubt surgical or emergent cause of symptoms. Patient has a follow-up appointment with PCP on 8/20.  Advised him to return to ED for any severe worsening symptoms.  Portions of this note were generated with Scientist, clinical (histocompatibility and immunogenetics)Dragon dictation software. Dictation errors may occur despite best attempts at proofreading.   Final Clinical Impressions(s) / ED Diagnoses   Final diagnoses:  Diverticulitis    ED Discharge Orders        Ordered    amoxicillin-clavulanate (AUGMENTIN) 875-125 MG tablet  Every 12 hours     07/13/18 0825       Dietrich PatesKhatri, Dio Giller, PA-C 07/13/18 0831    Zadie RhineWickline, Donald, MD 07/13/18 [phone]

## 2018-07-31 ENCOUNTER — Ambulatory Visit: Payer: Self-pay | Attending: Nurse Practitioner | Admitting: Nurse Practitioner

## 2018-07-31 ENCOUNTER — Encounter: Payer: Self-pay | Admitting: Nurse Practitioner

## 2018-07-31 VITALS — BP 129/85 | HR 64 | Temp 98.8°F | Ht 66.0 in | Wt 196.2 lb

## 2018-07-31 DIAGNOSIS — K5732 Diverticulitis of large intestine without perforation or abscess without bleeding: Secondary | ICD-10-CM | POA: Insufficient documentation

## 2018-07-31 DIAGNOSIS — Z79899 Other long term (current) drug therapy: Secondary | ICD-10-CM | POA: Insufficient documentation

## 2018-07-31 DIAGNOSIS — F17200 Nicotine dependence, unspecified, uncomplicated: Secondary | ICD-10-CM

## 2018-07-31 DIAGNOSIS — Z79891 Long term (current) use of opiate analgesic: Secondary | ICD-10-CM | POA: Insufficient documentation

## 2018-07-31 DIAGNOSIS — Z8249 Family history of ischemic heart disease and other diseases of the circulatory system: Secondary | ICD-10-CM | POA: Insufficient documentation

## 2018-07-31 DIAGNOSIS — Z8349 Family history of other endocrine, nutritional and metabolic diseases: Secondary | ICD-10-CM | POA: Insufficient documentation

## 2018-07-31 DIAGNOSIS — F17210 Nicotine dependence, cigarettes, uncomplicated: Secondary | ICD-10-CM | POA: Insufficient documentation

## 2018-07-31 DIAGNOSIS — Z833 Family history of diabetes mellitus: Secondary | ICD-10-CM | POA: Insufficient documentation

## 2018-07-31 DIAGNOSIS — K5792 Diverticulitis of intestine, part unspecified, without perforation or abscess without bleeding: Secondary | ICD-10-CM

## 2018-07-31 DIAGNOSIS — Z792 Long term (current) use of antibiotics: Secondary | ICD-10-CM | POA: Insufficient documentation

## 2018-07-31 NOTE — Progress Notes (Signed)
Assessment & Plan:  Ruben GensJeffrey was seen today for new patient (initial visit).  Diagnoses and all orders for this visit:  Diverticulitis Resolved.  Patient was counseled on dietary and lifestyle modifications  Tobacco dependence Ruben GensJeffrey was counseled on the dangers of tobacco use, and was advised to quit. Reviewed strategies to maximize success, including removing cigarettes and smoking materials from environment, stress management and support of family/friends as well as pharmacological alternatives including: Wellbutrin, Chantix, Nicotine patch, Nicotine gum or lozenges. Smoking cessation support: smoking cessation hotline: 1-800-QUIT-NOW.  Smoking cessation classes are also available through [facility]. Call [phone] or visit our website at [email or web address].   A total of 3 minutes was spent on counseling for smoking cessation and Ruben GensJeffrey is not ready to quit.   Patient has been counseled on age-appropriate routine health concerns for screening and prevention. These are reviewed and up-to-date. Referrals have been placed accordingly. Immunizations are up-to-date or declined.    Subjective:   Chief Complaint  Patient presents with  . New Patient (Initial Visit)    Pt. is here to establish care. Pt. stated the antibiotic he got for his diverticulitis helped him.    HPI Ruben Jacobs 42 y.o. male presents to office today to establish care.    Diverticulitis He also has a history of diverticulitis. He was seen in the ED on 07-13-2018 with complaints of LLQ abdominal and diarrhea.  He was treated with p.o. Augmentin and discharged home. Prior to this ED visit he was also treated for diverticulitis of the large intestine without perforation or abscess on 06-13-2018 in the ED. at that time he was discharged home on Cipro p.o. and Flagyl p.o. Today he reports complete resolution of symptoms related to diverticulitis.  He is trying to follow a generally healthy  diet and taking probiotics as well.    Review of Systems  Constitutional: Negative for fever, malaise/fatigue and weight loss.  HENT: Negative.  Negative for nosebleeds.   Eyes: Negative.  Negative for blurred vision, double vision and photophobia.  Respiratory: Negative.  Negative for cough and shortness of breath.   Cardiovascular: Negative.  Negative for chest pain, palpitations and leg swelling.  Gastrointestinal: Negative.  Negative for abdominal pain, blood in stool, constipation, diarrhea, heartburn, melena, nausea and vomiting.  Musculoskeletal: Negative.  Negative for myalgias.  Neurological: Negative.  Negative for dizziness, focal weakness, seizures and headaches.  Psychiatric/Behavioral: Negative.  Negative for suicidal ideas.    Past Medical History:  Diagnosis Date  . Diverticulitis     History reviewed. No pertinent surgical history.  Family History  Problem Relation Age of Onset  . Diabetes Neg Hx   . Hyperlipidemia Neg Hx   . Hypertension Neg Hx     Social History Reviewed with no changes to be made today.   Outpatient Medications Prior to Visit  Medication Sig Dispense Refill  . amoxicillin-clavulanate (AUGMENTIN) 875-125 MG tablet Take 1 tablet by mouth every 12 (twelve) hours. 14 tablet 0  . ciprofloxacin (CIPRO) 500 MG tablet Take 1 tablet (500 mg total) by mouth 2 (two) times daily. (Patient not taking: Reported on 07/13/2018) 14 tablet 0  . cyclobenzaprine (FLEXERIL) 10 MG tablet Take 1 tablet (10 mg total) by mouth 3 (three) times daily as needed for muscle spasms. (Patient not taking: Reported on 06/13/2018) 20 tablet 0  . ibuprofen (ADVIL,MOTRIN) 800 MG tablet Take 1 tablet (800 mg total) by mouth 3 (three) times daily. (Patient not taking: Reported on 06/13/2018)  21 tablet 0  . metroNIDAZOLE (FLAGYL) 500 MG tablet Take 1 tablet (500 mg total) by mouth 2 (two) times daily. (Patient not taking: Reported on 07/13/2018) 14 tablet 0  . ondansetron (ZOFRAN ODT) 4  MG disintegrating tablet Take 1 tablet (4 mg total) by mouth every 8 (eight) hours as needed for nausea. (Patient not taking: Reported on 06/30/2015) 10 tablet 0  . traMADol (ULTRAM) 50 MG tablet Take 1 tablet (50 mg total) by mouth every 6 (six) hours as needed for pain. (Patient not taking: Reported on 06/30/2015) 10 tablet 0   No facility-administered medications prior to visit.     No Known Allergies     Objective:    BP 129/85 (BP Location: Left Arm, Patient Position: Sitting, Cuff Size: Large)   Pulse 64   Temp 98.8 F (37.1 C) (Oral)   Ht 5\' 6"  (1.676 m)   Wt 196 lb 3.2 oz (89 kg)   SpO2 95%   BMI 31.67 kg/m  Wt Readings from Last 3 Encounters:  07/31/18 196 lb 3.2 oz (89 kg)  07/13/18 210 lb (95.3 kg)  06/13/18 215 lb (97.5 kg)    Physical Exam  Constitutional: He is oriented to person, place, and time. He appears well-developed and well-nourished. He is cooperative.  HENT:  Head: Normocephalic and atraumatic.  Eyes: EOM are normal.  Neck: Normal range of motion.  Cardiovascular: Normal rate, regular rhythm and normal heart sounds. Exam reveals no gallop and no friction rub.  No murmur heard. Pulmonary/Chest: Effort normal and breath sounds normal. No tachypnea. No respiratory distress. He has no decreased breath sounds. He has no wheezes. He has no rhonchi. He has no rales. He exhibits no tenderness.  Abdominal: Soft. Bowel sounds are normal. He exhibits no distension and no mass. There is no tenderness. There is no rebound and no guarding.  Musculoskeletal: Normal range of motion. He exhibits no edema.  Neurological: He is alert and oriented to person, place, and time. Coordination normal.  Skin: Skin is warm and dry.  Psychiatric: He has a normal mood and affect. His behavior is normal. Judgment and thought content normal.  Nursing note and vitals reviewed.        Patient has been counseled extensively about nutrition and exercise as well as the importance of  adherence with medications and regular follow-up. The patient was given clear instructions to go to ER or return to medical center if symptoms don't improve, worsen or new problems develop. The patient verbalized understanding.   Follow-up: Return in about 2 months (around 09/30/2018) for needs physical next appointment .   Claiborne RiggZelda W Maire Govan, FNP-BC [facility] [place], [place] [phone]   07/31/2018, 12:02 PM

## 2018-07-31 NOTE — Patient Instructions (Signed)

## 2018-09-17 ENCOUNTER — Encounter (HOSPITAL_COMMUNITY): Payer: Self-pay | Admitting: Emergency Medicine

## 2018-09-17 ENCOUNTER — Ambulatory Visit (HOSPITAL_COMMUNITY)
Admission: EM | Admit: 2018-09-17 | Discharge: 2018-09-17 | Disposition: A | Discharge status: Home / Self Care | Payer: Self-pay | Attending: Family Medicine | Admitting: Family Medicine

## 2018-09-17 DIAGNOSIS — R300 Dysuria: Secondary | ICD-10-CM | POA: Insufficient documentation

## 2018-09-17 DIAGNOSIS — F17210 Nicotine dependence, cigarettes, uncomplicated: Secondary | ICD-10-CM | POA: Insufficient documentation

## 2018-09-17 LAB — POCT URINALYSIS DIP (DEVICE)
Glucose, UA: NEGATIVE mg/dL
Ketones, ur: NEGATIVE mg/dL
LEUKOCYTES UA: NEGATIVE
NITRITE: NEGATIVE
PH: 6 (ref 5.0–8.0)
Protein, ur: NEGATIVE mg/dL
Specific Gravity, Urine: >=1.03 (ref 1.005–1.030)
Urobilinogen, UA: 0.2 mg/dL (ref 0.0–1.0)

## 2018-09-17 MED ORDER — CEFTRIAXONE SODIUM 250 MG IJ SOLR
INTRAMUSCULAR | Status: AC
  Filled 2018-09-17: qty 250

## 2018-09-17 MED ORDER — CEFTRIAXONE SODIUM 250 MG IJ SOLR
250.0000 mg | Freq: Once | INTRAMUSCULAR | Status: AC
  Administered 2018-09-17: 250 mg via INTRAMUSCULAR

## 2018-09-17 MED ORDER — AZITHROMYCIN 250 MG PO TABS
ORAL_TABLET | ORAL | Status: AC
  Filled 2018-09-17: qty 4

## 2018-09-17 MED ORDER — AZITHROMYCIN 250 MG PO TABS
1000.0000 mg | ORAL_TABLET | Freq: Once | ORAL | Status: AC
  Administered 2018-09-17: 1000 mg via ORAL

## 2018-09-17 NOTE — ED Provider Notes (Signed)
[facility]    CSN: [id number] Arrival date & time: 09/17/18  1027     History   Chief Complaint Chief Complaint  Patient presents with  . Dysuria    HPI Ruben Jacobs is a 42 y.o. male.   This 42 year old man presents with one-week history of dysuria.  He notes that he has had a new partner within the last 2 weeks.  He denies any joint pain, discharge, fever, or previous symptoms like this.  He says his pain is burns before and after he goes to the bathroom.     Past Medical History:  Diagnosis Date  . Diverticulitis     There are no active problems to display for this patient.   History reviewed. No pertinent surgical history.     Home Medications    Prior to Admission medications   Not on File    Family History Family History  Problem Relation Age of Onset  . Diabetes Neg Hx   . Hyperlipidemia Neg Hx   . Hypertension Neg Hx     Social History Social History   Tobacco Use  . Smoking status: Current Every Day Smoker    Packs/day: 0.15    Types: Cigarettes  . Smokeless tobacco: Never Used  Substance Use Topics  . Alcohol use: Yes    Alcohol/week: 28.0 standard drinks    Types: 28 Cans of beer per week    Comment: 4 40 oz per day  . Drug use: Yes    Types: Marijuana     Allergies   Patient has no known allergies.   Review of Systems Review of Systems   Physical Exam Triage Vital Signs ED Triage Vitals  Enc Vitals Group     BP 09/17/18 1115 136/81     Pulse Rate 09/17/18 1115 65     Resp 09/17/18 1115 16     Temp 09/17/18 1115 98.1 F (36.7 C)     Temp Source 09/17/18 1115 Oral     SpO2 09/17/18 1115 100 %     Weight --      Height --      Head Circumference --      Peak Flow --      Pain Score 09/17/18 1116 0     Pain Loc --      Pain Edu? --      Excl. in GC? --    No data found.  Updated Vital Signs BP 136/81   Pulse 65   Temp 98.1 F (36.7 C) (Oral)   Resp 16   SpO2 100%    Physical Exam    Constitutional: He is oriented to person, place, and time. He appears well-developed and well-nourished.  HENT:  Right Ear: External ear normal.  Left Ear: External ear normal.  Eyes: Conjunctivae are normal.  Neck: Normal range of motion. Neck supple.  Pulmonary/Chest: Effort normal.  Genitourinary: Penis normal.  Musculoskeletal: Normal range of motion.  Neurological: He is alert and oriented to person, place, and time.  Skin: Skin is warm and dry.  Nursing note and vitals reviewed.    UC Treatments / Results  Labs (all labs ordered are listed, but only abnormal results are displayed) Labs Reviewed  URINE CYTOLOGY ANCILLARY ONLY    EKG None  Radiology No results found.  Procedures Procedures (including critical care time)  Medications Ordered in UC Medications  cefTRIAXone (ROCEPHIN) injection 250 mg (has no administration in time range)  azithromycin (ZITHROMAX) tablet 1,000  mg (has no administration in time range)    Initial Impression / Assessment and Plan / UC Course  I have reviewed the triage vital signs and the nursing notes.  Pertinent labs & imaging results that were available during my care of the patient were reviewed by me and considered in my medical decision making (see chart for details).    Final Clinical Impressions(s) / UC Diagnoses   Final diagnoses:  Dysuria     Discharge Instructions     Your urine does not show an active bacterial bladder infection.  This means that you may have an STD and therefore I am giving you medicine to treat both chlamydia and gonorrhea.    ED Prescriptions    None     Controlled Substance Prescriptions [place] Controlled Substance Registry consulted? Not Applicable   Elvina Sidle, MD 09/17/18 1229

## 2018-09-17 NOTE — ED Triage Notes (Signed)
PT reports dysuria for 1 week.

## 2018-09-17 NOTE — Discharge Instructions (Addendum)
Your urine does not show an active bacterial bladder infection.  This means that you may have an STD and therefore I am giving you medicine to treat both chlamydia and gonorrhea.

## 2018-09-18 LAB — URINE CYTOLOGY ANCILLARY ONLY
Chlamydia: NEGATIVE
Neisseria Gonorrhea: NEGATIVE
Trichomonas: POSITIVE — AB

## 2018-09-20 ENCOUNTER — Telehealth (HOSPITAL_COMMUNITY): Payer: Self-pay

## 2018-09-20 MED ORDER — METRONIDAZOLE 500 MG PO TABS: 2000.0000 mg | ORAL_TABLET | Freq: Once | ORAL | 0 refills | Status: AC

## 2018-09-20 NOTE — Telephone Encounter (Signed)
Trichomonas is positive. Rx metronidazole 2000 mg no refills was sent to the pharmacy of record. PT called and made aware.  Educated patient to refrain from sexual intercourse for 7 days to give the medicine time to work. Sexual partners need to be notified and tested/treated. Condoms may reduce risk of reinfection. Recheck for further evaluation if symptoms are not improving. Pt verbalized understanding.

## 2018-10-01 ENCOUNTER — Encounter: Payer: Self-pay | Admitting: Nurse Practitioner

## 2018-10-23 ENCOUNTER — Encounter: Payer: Self-pay | Admitting: Nurse Practitioner

## 2020-02-24 IMAGING — CT CT ABD-PELV W/ CM
2 of 5 series · 16 of 46 positions shown, 18 images · IV contrast (ISOVUE)
Comparison: None.

CLINICAL DATA: Abdominal pain with diverticulitis suspected

EXAM:
CT ABDOMEN AND PELVIS WITH CONTRAST
TECHNIQUE: Multidetector CT imaging of the abdomen and pelvis was performed
using the standard protocol following bolus administration of
intravenous contrast.
CONTRAST:  100mL V57UTC-RII IOPAMIDOL (V57UTC-RII) INJECTION 61%

[Series 2: axial st · axial · 0.80mm/px · z∈[-420,-20]mm · 13 of 94 slices shown, 15 images]
[im 7/94  soft-tissue]
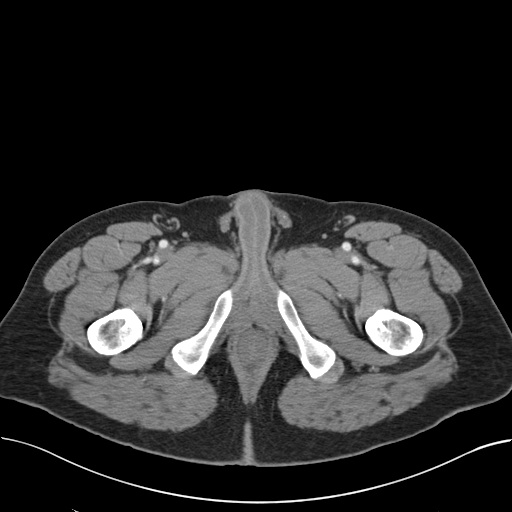
[im 7/94  bone]
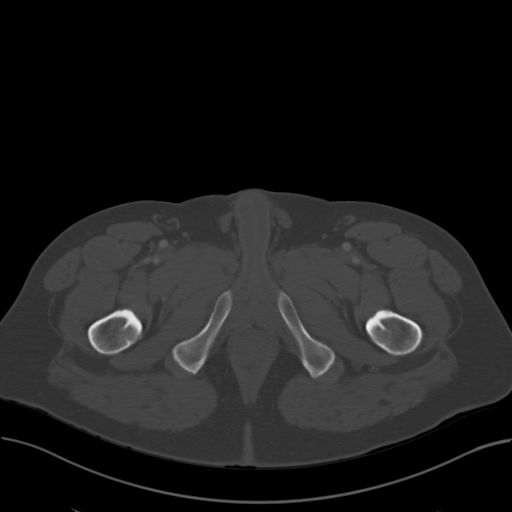
[im 14/94  soft-tissue]
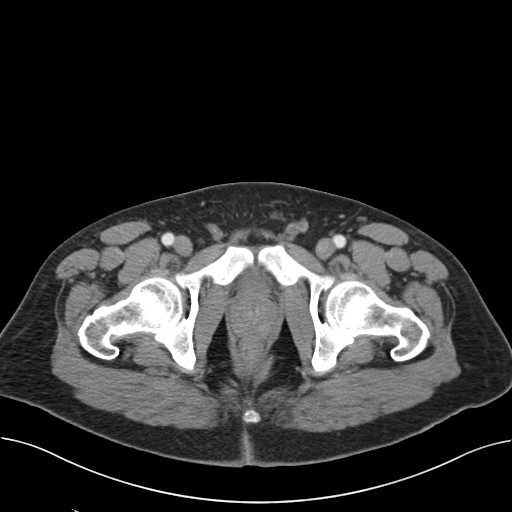
[im 20/94  soft-tissue]
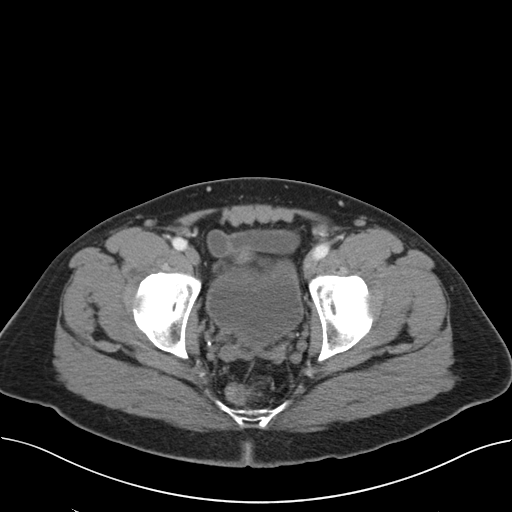
[im 27/94  soft-tissue]
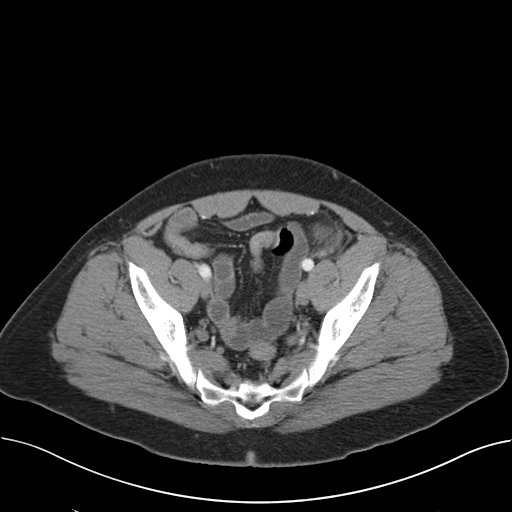
[im 34/94  soft-tissue]
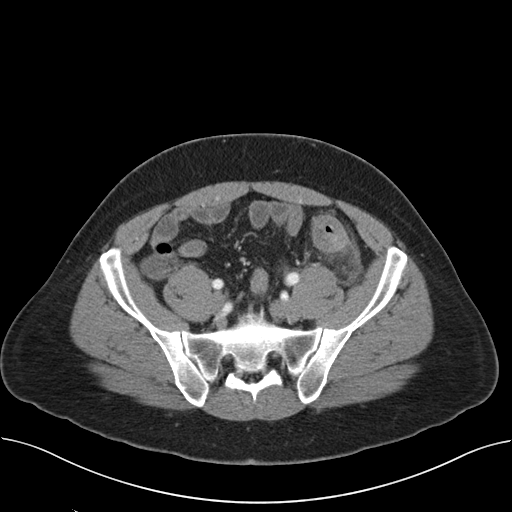
[im 40/94  soft-tissue]
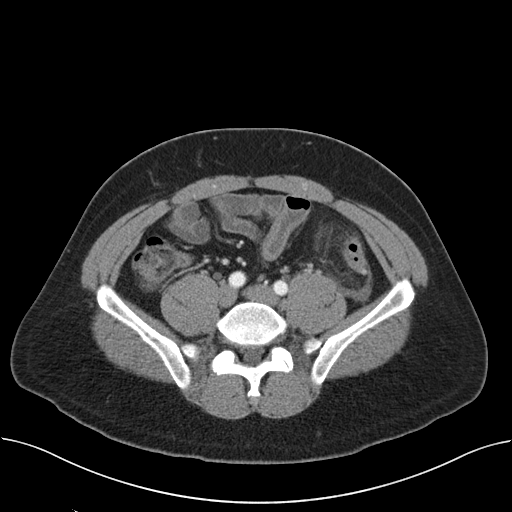
[im 47/94  soft-tissue]
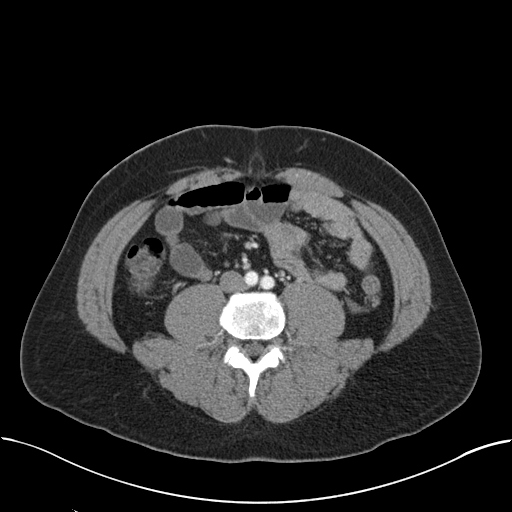
[im 54/94  soft-tissue]
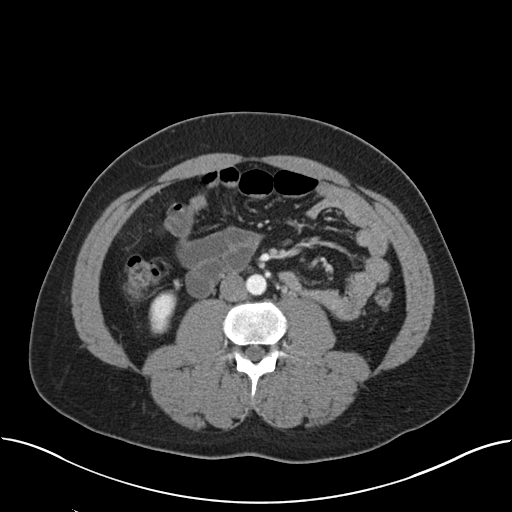
[im 60/94  soft-tissue]
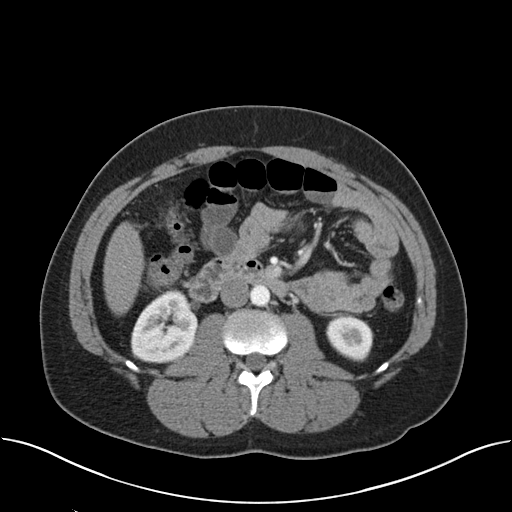
[im 60/94  bone]
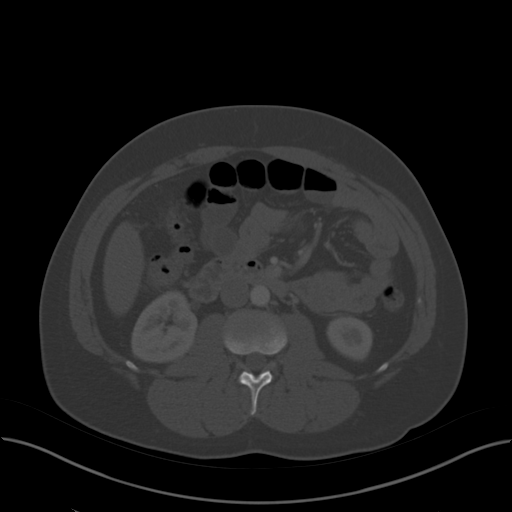
[im 67/94  soft-tissue]
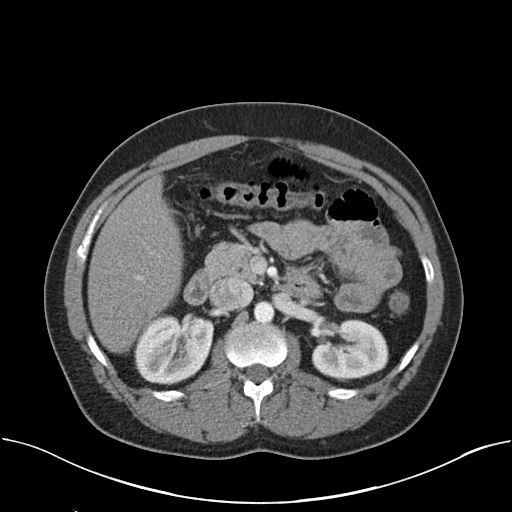
[im 74/94  soft-tissue]
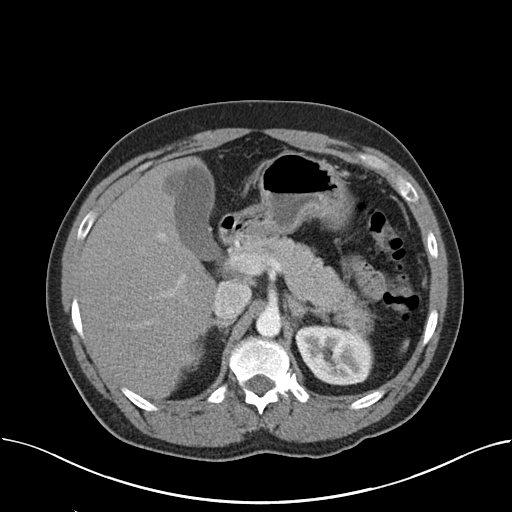
[im 80/94  soft-tissue]
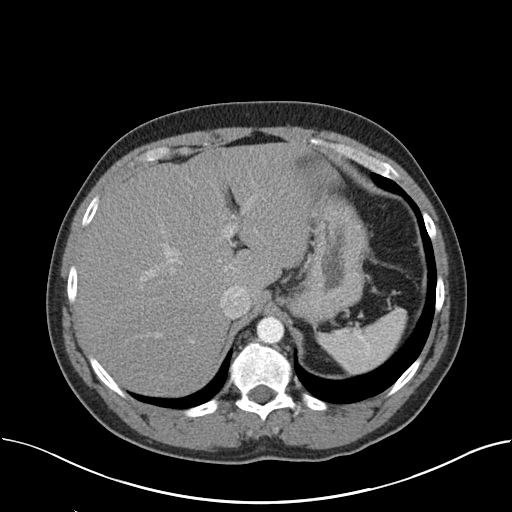
[im 87/94  soft-tissue]
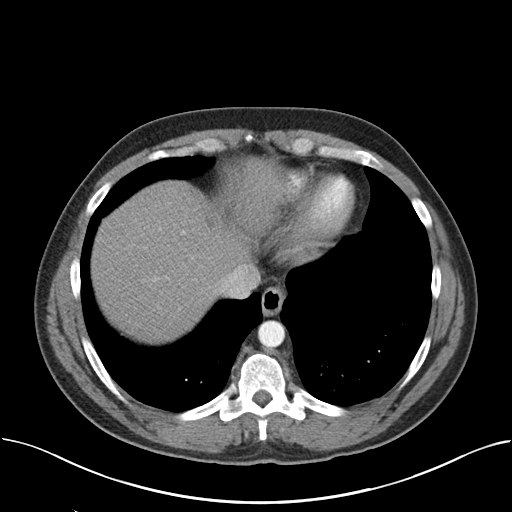

[Series 5: coronal st · coronal · 0.78mm/px · 3 of 96 slices shown]
[im 32/96  soft-tissue]
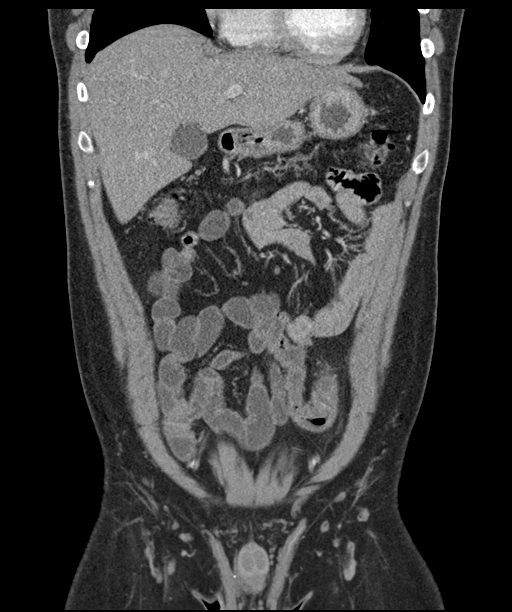
[im 43/96  soft-tissue]
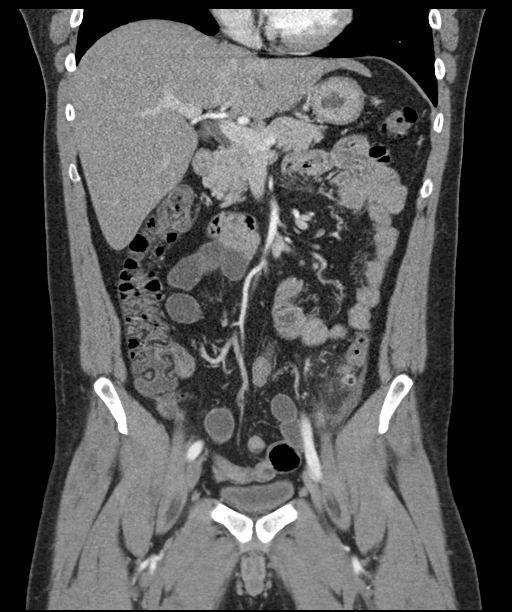
[im 53/96  soft-tissue]
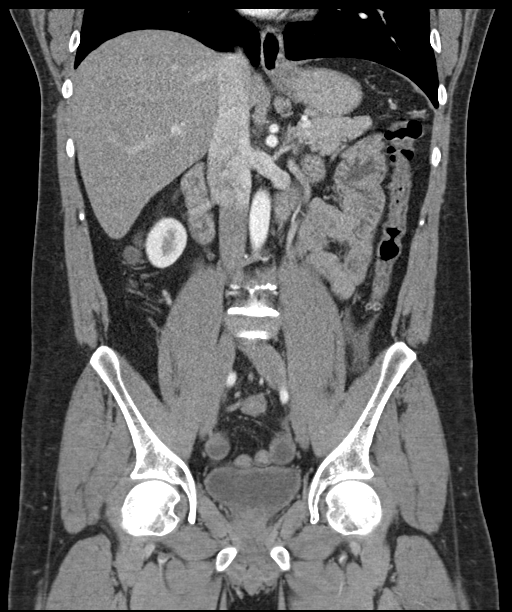

[16 of 46 positions shown; findings below may reference images not displayed]

FINDINGS: Lower chest:  No contributory findings.

Hepatobiliary: No focal liver abnormality.No evidence of biliary
obstruction or stone.

Pancreas: Unremarkable.

Spleen: Unremarkable.

Adrenals/Urinary Tract: Negative adrenals. No hydronephrosis or
stone. Small right renal cystic intensity unremarkable bladder.

Stomach/Bowel: Focal segment of colonic wall thickening from
submucosal low-density at the descending sigmoid junction, centered
around a thickened diverticulum. Distal colonic diverticulosis that
is overall mild in extent. No obstruction. No appendicitis.

Vascular/Lymphatic: No acute vascular abnormality. No mass or
adenopathy.

Reproductive:No pathologic findings.

Other: No ascites or pneumoperitoneum.

Musculoskeletal: No acute abnormalities.
IMPRESSION: 1. Diverticulitis at the descending sigmoid junction. No
complicating abscess or pneumoperitoneum.
2. Descending and sigmoid colonic diverticulosis.

## 2024-06-06 ENCOUNTER — Encounter (HOSPITAL_COMMUNITY): Payer: Self-pay

## 2024-06-06 ENCOUNTER — Other Ambulatory Visit: Payer: Self-pay

## 2024-06-06 ENCOUNTER — Emergency Department (HOSPITAL_COMMUNITY)
Admission: EM | Admit: 2024-06-06 | Discharge: 2024-06-06 | Disposition: A | Discharge status: Home / Self Care | Payer: Self-pay

## 2024-06-06 DIAGNOSIS — K5792 Diverticulitis of intestine, part unspecified, without perforation or abscess without bleeding: Secondary | ICD-10-CM | POA: Insufficient documentation

## 2024-06-06 LAB — URINALYSIS, ROUTINE W REFLEX MICROSCOPIC
Bacteria, UA: NONE SEEN
Bilirubin Urine: NEGATIVE
Glucose, UA: NEGATIVE mg/dL
Hgb urine dipstick: NEGATIVE
Ketones, ur: NEGATIVE mg/dL
Leukocytes,Ua: NEGATIVE
Nitrite: NEGATIVE
Protein, ur: 30 mg/dL — AB
Specific Gravity, Urine: 1.027 (ref 1.005–1.030)
pH: 5 (ref 5.0–8.0)

## 2024-06-06 LAB — COMPREHENSIVE METABOLIC PANEL WITH GFR
ALT: 26 U/L (ref 0–44)
AST: 33 U/L (ref 15–41)
Albumin: 4.3 g/dL (ref 3.5–5.0)
Alkaline Phosphatase: 99 U/L (ref 38–126)
Anion gap: 11 (ref 5–15)
BUN: 6 mg/dL (ref 6–20)
CO2: 21 mmol/L — ABNORMAL LOW (ref 22–32)
Calcium: 9.2 mg/dL (ref 8.9–10.3)
Chloride: 107 mmol/L (ref 98–111)
Creatinine, Ser: 0.89 mg/dL (ref 0.61–1.24)
GFR, Estimated: >60 mL/min (ref >60)
Glucose, Bld: 110 mg/dL — ABNORMAL HIGH (ref 70–99)
Potassium: 3.7 mmol/L (ref 3.5–5.1)
Sodium: 139 mmol/L (ref 135–145)
Total Bilirubin: 1 mg/dL (ref 0.0–1.2)
Total Protein: 8.7 g/dL — ABNORMAL HIGH (ref 6.5–8.1)

## 2024-06-06 LAB — CBC
HCT: 46.1 % (ref 39.0–52.0)
Hemoglobin: 14.6 g/dL (ref 13.0–17.0)
MCH: 28.4 pg (ref 26.0–34.0)
MCHC: 31.7 g/dL (ref 30.0–36.0)
MCV: 89.7 fL (ref 80.0–100.0)
Platelets: 223 10*3/uL (ref 150–400)
RBC: 5.14 MIL/uL (ref 4.22–5.81)
RDW: 14.8 % (ref 11.5–15.5)
WBC: 7.4 10*3/uL (ref 4.0–10.5)
nRBC: 0 % (ref 0.0–0.2)

## 2024-06-06 LAB — LIPASE, BLOOD: Lipase: 33 U/L (ref 11–51)

## 2024-06-06 MED ORDER — AMOXICILLIN-POT CLAVULANATE 875-125 MG PO TABS: 1.0000 | ORAL_TABLET | Freq: Two times a day (BID) | ORAL | 0 refills | Status: AC

## 2024-06-06 MED ORDER — ONDANSETRON 4 MG PO TBDP: 4.0000 mg | ORAL_TABLET | Freq: Three times a day (TID) | ORAL | 0 refills | Status: AC | PRN

## 2024-06-06 MED ORDER — OXYCODONE-ACETAMINOPHEN 5-325 MG PO TABS: 1.0000 | ORAL_TABLET | Freq: Four times a day (QID) | ORAL | 0 refills | Status: AC | PRN

## 2024-06-06 NOTE — ED Triage Notes (Signed)
 Pt presents to ED from home C/O LLQ abdominal pain, diarrhea X 3 days. Pt reports hx diverticulitis, feels similar to that.

## 2024-06-06 NOTE — ED Provider Notes (Signed)
 [place] EMERGENCY DEPARTMENT AT [facility] Provider Note   CSN: [id number] Arrival date & time: 06/06/24  1205     Patient presents with: Abdominal Pain   Ruben Jacobs is a 48 y.o. male.   This is a 48 year old male presenting emergency department for left lower quadrant abdominal pain.  Symptoms x 2 days.  Has had several episodes of nonbloody diarrhea as well.  No fevers, chills, lightheadedness, dizziness, chest pain, shortness of breath.  He notes this feels similar to his last episodes of diverticulitis.   Abdominal Pain      Prior to Admission medications   Medication Sig Start Date End Date Taking? Authorizing Provider  amoxicillin -clavulanate (AUGMENTIN ) 875-125 MG tablet Take 1 tablet by mouth 2 (two) times daily for 7 days. 06/06/24 06/13/24 Yes Daeron Carreno, Caron PARAS, DO  ondansetron  (ZOFRAN -ODT) 4 MG disintegrating tablet Take 1 tablet (4 mg total) by mouth every 8 (eight) hours as needed for nausea or vomiting. 06/06/24  Yes Neysa Caron PARAS, DO  oxyCODONE-acetaminophen  (PERCOCET/ROXICET) 5-325 MG tablet Take 1 tablet by mouth every 6 (six) hours as needed for up to 5 days for severe pain (pain score 7-10). 06/06/24 06/11/24 Yes Neysa Caron PARAS, DO    Allergies: Patient has no known allergies.    Review of Systems  Gastrointestinal:  Positive for abdominal pain.    Updated Vital Signs BP (!) 155/99 (BP Location: Right Arm)   Pulse (!) 52   Temp (!) 97.4 F (36.3 C) (Oral)   Resp 18   Ht 5' 6 (1.676 m)   Wt 81.6 kg   SpO2 100%   BMI 29.05 kg/m   Physical Exam Vitals and nursing note reviewed.  Constitutional:      General: He is not in acute distress.    Appearance: He is not ill-appearing or toxic-appearing.   Cardiovascular:     Rate and Rhythm: Normal rate and regular rhythm.  Pulmonary:     Effort: Pulmonary effort is normal.     Breath sounds: Normal breath sounds.  Abdominal:     Palpations: Abdomen is soft.     Tenderness: There is  abdominal tenderness (mild) in the left lower quadrant.   Neurological:     General: No focal deficit present.     Mental Status: He is alert.   Psychiatric:        Mood and Affect: Mood normal.        Behavior: Behavior normal.     (all labs ordered are listed, but only abnormal results are displayed) Labs Reviewed  COMPREHENSIVE METABOLIC PANEL WITH GFR - Abnormal; Notable for the following components:      Result Value   CO2 21 (*)    Glucose, Bld 110 (*)    Total Protein 8.7 (*)    All other components within normal limits  URINALYSIS, ROUTINE W REFLEX MICROSCOPIC - Abnormal; Notable for the following components:   Color, Urine AMBER (*)    Protein, ur 30 (*)    All other components within normal limits  LIPASE, BLOOD  CBC    EKG: None  Radiology: No results found.   Procedures   Medications Ordered in the ED - No data to display                                  Medical Decision Making This is a well-appearing 48 year old male presenting emergency department for left  lower quadrant abdominal pain.  He is afebrile vital signs reassuring.  Physical exam with some minor tenderness to left lower quadrant.  Per chart review had episodes of diverticulitis in 2019.  His workup today reassuring no leukocytosis to suggest systemic infection.  No anemia.  Comprehensive panel with no significant metabolic derangements.  No AKI.  No transaminitis to suggest hepatobiliary disease.  Lipase normal.  Pancreatitis unlikely.  UA without evidence of urinary tract infection or hematuria to suggest stones.  Given clinical history suspect acute diverticulitis.  Will give trial of antibiotics and forego imaging at this time given patient's overall clinical picture and physical exam.  Urged patient to follow-up with primary doctor and gastroenterology.  Will discharge in stable condition at this time.  Amount and/or Complexity of Data Reviewed External Data Reviewed:     Details: Had CT  scans of 2019 that showed diverticulitis Labs: ordered. Radiology:     Details: Considered; see above  Risk Prescription drug management. Decision regarding hospitalization.      Final diagnoses:  Diverticulitis    ED Discharge Orders          Ordered    amoxicillin -clavulanate (AUGMENTIN ) 875-125 MG tablet  2 times daily        06/06/24 1604    ondansetron  (ZOFRAN -ODT) 4 MG disintegrating tablet  Every 8 hours PRN        06/06/24 1605    oxyCODONE-acetaminophen  (PERCOCET/ROXICET) 5-325 MG tablet  Every 6 hours PRN        06/06/24 1605               Neysa Caron PARAS, DO 06/06/24 1635

## 2024-06-06 NOTE — ED Provider Triage Note (Signed)
 Emergency Medicine Provider Triage Evaluation Note  Ruben Jacobs , a 48 y.o. male  was evaluated in triage.  Pt complains of LLQ abd pain x 2 days.  Review of Systems  Positive: diarrhea Negative: N/V, blood in stool, fever, chills  Physical Exam  BP (!) 146/103 (BP Location: Left Arm)   Pulse 66   Temp 98 F (36.7 C) (Oral)   Resp 16   Ht 5' 6 (1.676 m)   Wt 81.6 kg   SpO2 98%   BMI 29.05 kg/m  Gen:   Awake, no distress   Resp:  Normal effort MSK:   Moves extremities without difficulty  Other:    Medical Decision Making  Medically screening exam initiated at 12:56 PM.  Appropriate orders placed.  Ruben Jacobs was informed that the remainder of the evaluation will be completed by another provider, this initial triage assessment does not replace that evaluation, and the importance of remaining in the ED until their evaluation is complete.  Labs ordered   Francis Ileana SAILOR, PA-C 06/06/24 1257

## 2024-06-06 NOTE — Discharge Instructions (Addendum)
 Please follow-up with your primary doctor.  We are prescribing antibiotics for diverticulitis.  Please take them as prescribed for the full course even if your symptoms improved.  Please try to keep a liquid diet for the next several days and slowly introduce solid foods.  Please take Tylenol  alternating with ibuprofen  for baseline pain control.  We are prescribing you narcotic pain medications for breakthrough pain.  Use nausea meds as needed.  Return immediately for fevers, chills, worsening abdominal pain, inability to tolerate antibiotics due to nausea and vomiting or you develop any new or worsening symptoms that are concerning to you.
